# Patient Record
Sex: Female | Born: 1955 | Race: White | Hispanic: No | Marital: Married | Smoking: Former smoker
Health system: Southern US, Community
[De-identification: ages and names within clinical notes are randomized; demographics above are authoritative.]

## PROBLEM LIST (undated history)

## (undated) DIAGNOSIS — M1990 Unspecified osteoarthritis, unspecified site: Secondary | ICD-10-CM

## (undated) DIAGNOSIS — R12 Heartburn: Secondary | ICD-10-CM

## (undated) DIAGNOSIS — R319 Hematuria, unspecified: Secondary | ICD-10-CM

## (undated) DIAGNOSIS — C801 Malignant (primary) neoplasm, unspecified: Secondary | ICD-10-CM

## (undated) DIAGNOSIS — Z905 Acquired absence of kidney: Secondary | ICD-10-CM

## (undated) DIAGNOSIS — E785 Hyperlipidemia, unspecified: Secondary | ICD-10-CM

## (undated) DIAGNOSIS — J939 Pneumothorax, unspecified: Secondary | ICD-10-CM

## (undated) HISTORY — DX: Heartburn: R12

## (undated) HISTORY — PX: KIDNEY SURGERY: SHX687

## (undated) HISTORY — DX: Unspecified osteoarthritis, unspecified site: M19.90

## (undated) HISTORY — DX: Pneumothorax, unspecified: J93.9

## (undated) HISTORY — DX: Hematuria, unspecified: R31.9

## (undated) HISTORY — DX: Hyperlipidemia, unspecified: E78.5

---

## 2004-05-01 DIAGNOSIS — I2699 Other pulmonary embolism without acute cor pulmonale: Secondary | ICD-10-CM

## 2004-05-01 DIAGNOSIS — J939 Pneumothorax, unspecified: Secondary | ICD-10-CM

## 2004-05-01 HISTORY — DX: Other pulmonary embolism without acute cor pulmonale: I26.99

## 2004-05-01 HISTORY — DX: Pneumothorax, unspecified: J93.9

## 2005-01-09 ENCOUNTER — Ambulatory Visit: Payer: Self-pay

## 2005-10-22 ENCOUNTER — Inpatient Hospital Stay: Payer: Self-pay | Admitting: Internal Medicine

## 2005-10-22 ENCOUNTER — Other Ambulatory Visit: Payer: Self-pay

## 2006-04-09 ENCOUNTER — Ambulatory Visit: Payer: Self-pay | Admitting: Internal Medicine

## 2006-04-19 ENCOUNTER — Ambulatory Visit: Payer: Self-pay | Admitting: Internal Medicine

## 2006-05-03 ENCOUNTER — Ambulatory Visit: Payer: Self-pay | Admitting: Internal Medicine

## 2006-06-01 ENCOUNTER — Ambulatory Visit: Payer: Self-pay | Admitting: Internal Medicine

## 2006-06-01 ENCOUNTER — Ambulatory Visit: Payer: Self-pay | Admitting: Psychiatry

## 2006-07-05 ENCOUNTER — Ambulatory Visit: Payer: Self-pay | Admitting: Internal Medicine

## 2006-07-17 ENCOUNTER — Ambulatory Visit: Payer: Self-pay | Admitting: Psychiatry

## 2006-07-31 ENCOUNTER — Ambulatory Visit: Payer: Self-pay | Admitting: Internal Medicine

## 2006-09-06 ENCOUNTER — Other Ambulatory Visit: Payer: Self-pay

## 2006-09-06 ENCOUNTER — Emergency Department: Payer: Self-pay | Admitting: Unknown Physician Specialty

## 2006-09-30 ENCOUNTER — Ambulatory Visit: Payer: Self-pay | Admitting: Internal Medicine

## 2006-10-17 ENCOUNTER — Ambulatory Visit: Payer: Self-pay | Admitting: Internal Medicine

## 2006-10-30 ENCOUNTER — Ambulatory Visit: Payer: Self-pay | Admitting: Internal Medicine

## 2007-01-24 ENCOUNTER — Ambulatory Visit: Payer: Self-pay

## 2007-11-18 ENCOUNTER — Ambulatory Visit: Payer: Self-pay

## 2008-12-01 ENCOUNTER — Ambulatory Visit: Payer: Self-pay | Admitting: Obstetrics and Gynecology

## 2009-10-01 ENCOUNTER — Emergency Department: Payer: Self-pay | Admitting: Emergency Medicine

## 2009-12-14 ENCOUNTER — Ambulatory Visit: Payer: Self-pay

## 2010-02-11 ENCOUNTER — Ambulatory Visit: Payer: Self-pay | Admitting: Gastroenterology

## 2010-06-25 ENCOUNTER — Ambulatory Visit: Payer: Self-pay | Admitting: Internal Medicine

## 2010-12-01 ENCOUNTER — Ambulatory Visit: Payer: Self-pay | Admitting: Obstetrics and Gynecology

## 2011-03-23 ENCOUNTER — Emergency Department: Payer: Self-pay | Admitting: Emergency Medicine

## 2011-12-05 ENCOUNTER — Ambulatory Visit: Payer: Self-pay

## 2011-12-11 ENCOUNTER — Ambulatory Visit: Payer: Self-pay

## 2012-07-24 ENCOUNTER — Emergency Department: Payer: Self-pay

## 2012-07-24 LAB — BASIC METABOLIC PANEL
Anion Gap: 6 — ABNORMAL LOW (ref 7–16)
Co2: 27 mmol/L (ref 21–32)
EGFR (African American): >60
Potassium: 4 mmol/L (ref 3.5–5.1)

## 2012-07-24 LAB — CBC
HCT: 37.3 % (ref 35.0–47.0)
HGB: 12.4 g/dL (ref 12.0–16.0)
MCH: 29.2 pg (ref 26.0–34.0)
MCHC: 33.3 g/dL (ref 32.0–36.0)
Platelet: 360 10*3/uL (ref 150–440)
RBC: 4.25 10*6/uL (ref 3.80–5.20)
WBC: 5.7 10*3/uL (ref 3.6–11.0)

## 2012-07-24 LAB — TROPONIN I: Troponin-I: <0.02 ng/mL

## 2012-07-26 ENCOUNTER — Emergency Department: Payer: Self-pay | Admitting: Emergency Medicine

## 2012-12-11 ENCOUNTER — Ambulatory Visit: Payer: Self-pay

## 2013-05-30 ENCOUNTER — Ambulatory Visit: Payer: Self-pay | Admitting: Family Medicine

## 2013-12-19 ENCOUNTER — Ambulatory Visit: Payer: Self-pay

## 2013-12-19 ENCOUNTER — Emergency Department: Payer: Self-pay | Admitting: Emergency Medicine

## 2013-12-19 LAB — BASIC METABOLIC PANEL
Anion Gap: 7 (ref 7–16)
BUN: 21 mg/dL — AB (ref 7–18)
CHLORIDE: 106 mmol/L (ref 98–107)
CREATININE: 0.99 mg/dL (ref 0.60–1.30)
Calcium, Total: 8.6 mg/dL (ref 8.5–10.1)
Co2: 28 mmol/L (ref 21–32)
EGFR (African American): >60
EGFR (Non-African Amer.): >60
GLUCOSE: 91 mg/dL (ref 65–99)
Osmolality: 284 (ref 275–301)
Potassium: 3.9 mmol/L (ref 3.5–5.1)
Sodium: 141 mmol/L (ref 136–145)

## 2013-12-19 LAB — CBC
HCT: 37.4 % (ref 35.0–47.0)
HGB: 12.2 g/dL (ref 12.0–16.0)
MCH: 29.1 pg (ref 26.0–34.0)
MCHC: 32.6 g/dL (ref 32.0–36.0)
MCV: 89 fL (ref 80–100)
PLATELETS: 275 10*3/uL (ref 150–440)
RBC: 4.19 10*6/uL (ref 3.80–5.20)
RDW: 13.1 % (ref 11.5–14.5)
WBC: 5.2 10*3/uL (ref 3.6–11.0)

## 2013-12-19 LAB — TROPONIN I: Troponin-I: <0.02 ng/mL

## 2013-12-19 LAB — D-DIMER(ARMC): D-Dimer: 372 ng/ml

## 2013-12-23 ENCOUNTER — Ambulatory Visit: Payer: Self-pay

## 2014-04-25 ENCOUNTER — Emergency Department: Payer: Self-pay | Admitting: Emergency Medicine

## 2014-08-29 ENCOUNTER — Emergency Department
Admit: 2014-08-29 | Disposition: A | Discharge status: Home / Self Care | Payer: Self-pay | Admitting: Emergency Medicine

## 2014-09-09 ENCOUNTER — Other Ambulatory Visit: Payer: Self-pay | Admitting: Surgery

## 2014-09-09 DIAGNOSIS — M7581 Other shoulder lesions, right shoulder: Secondary | ICD-10-CM

## 2014-09-09 DIAGNOSIS — S46911A Strain of unspecified muscle, fascia and tendon at shoulder and upper arm level, right arm, initial encounter: Secondary | ICD-10-CM

## 2014-09-15 ENCOUNTER — Ambulatory Visit
Admission: RE | Admit: 2014-09-15 | Discharge: 2014-09-15 | Disposition: A | Discharge status: Home / Self Care | Payer: BLUE CROSS/BLUE SHIELD | Source: Ambulatory Visit | Attending: Surgery | Admitting: Surgery

## 2014-09-15 DIAGNOSIS — M7500 Adhesive capsulitis of unspecified shoulder: Secondary | ICD-10-CM | POA: Insufficient documentation

## 2014-09-15 DIAGNOSIS — X58XXXA Exposure to other specified factors, initial encounter: Secondary | ICD-10-CM | POA: Diagnosis not present

## 2014-09-15 DIAGNOSIS — S43431A Superior glenoid labrum lesion of right shoulder, initial encounter: Secondary | ICD-10-CM | POA: Insufficient documentation

## 2014-09-15 DIAGNOSIS — M7581 Other shoulder lesions, right shoulder: Secondary | ICD-10-CM | POA: Diagnosis present

## 2014-09-15 DIAGNOSIS — S46911A Strain of unspecified muscle, fascia and tendon at shoulder and upper arm level, right arm, initial encounter: Secondary | ICD-10-CM

## 2014-12-25 ENCOUNTER — Other Ambulatory Visit: Payer: Self-pay | Admitting: Obstetrics and Gynecology

## 2014-12-25 DIAGNOSIS — Z1231 Encounter for screening mammogram for malignant neoplasm of breast: Secondary | ICD-10-CM

## 2014-12-28 ENCOUNTER — Ambulatory Visit
Admission: RE | Admit: 2014-12-28 | Discharge: 2014-12-28 | Disposition: A | Discharge status: Home / Self Care | Payer: BLUE CROSS/BLUE SHIELD | Source: Ambulatory Visit | Attending: Obstetrics and Gynecology | Admitting: Obstetrics and Gynecology

## 2014-12-28 DIAGNOSIS — Z1231 Encounter for screening mammogram for malignant neoplasm of breast: Secondary | ICD-10-CM | POA: Insufficient documentation

## 2014-12-28 HISTORY — DX: Malignant (primary) neoplasm, unspecified: C80.1

## 2015-04-07 ENCOUNTER — Other Ambulatory Visit: Payer: Self-pay | Admitting: Obstetrics and Gynecology

## 2015-11-11 ENCOUNTER — Other Ambulatory Visit: Payer: Self-pay | Admitting: Obstetrics and Gynecology

## 2015-11-11 DIAGNOSIS — Z1231 Encounter for screening mammogram for malignant neoplasm of breast: Secondary | ICD-10-CM

## 2015-12-10 ENCOUNTER — Other Ambulatory Visit: Payer: Self-pay | Admitting: Obstetrics and Gynecology

## 2015-12-10 DIAGNOSIS — N63 Unspecified lump in unspecified breast: Secondary | ICD-10-CM

## 2015-12-20 DIAGNOSIS — IMO0002 Reserved for concepts with insufficient information to code with codable children: Secondary | ICD-10-CM | POA: Insufficient documentation

## 2015-12-21 ENCOUNTER — Encounter (INDEPENDENT_AMBULATORY_CARE_PROVIDER_SITE_OTHER): Payer: Self-pay

## 2015-12-21 ENCOUNTER — Encounter: Payer: Self-pay | Admitting: Oncology

## 2015-12-21 ENCOUNTER — Inpatient Hospital Stay: Payer: BLUE CROSS/BLUE SHIELD | Attending: Oncology | Admitting: Oncology

## 2015-12-21 ENCOUNTER — Inpatient Hospital Stay: Payer: BLUE CROSS/BLUE SHIELD

## 2015-12-21 DIAGNOSIS — IMO0002 Reserved for concepts with insufficient information to code with codable children: Secondary | ICD-10-CM

## 2015-12-21 DIAGNOSIS — Z315 Encounter for genetic counseling: Secondary | ICD-10-CM | POA: Insufficient documentation

## 2015-12-21 DIAGNOSIS — Z7982 Long term (current) use of aspirin: Secondary | ICD-10-CM

## 2015-12-21 DIAGNOSIS — Z803 Family history of malignant neoplasm of breast: Secondary | ICD-10-CM | POA: Insufficient documentation

## 2015-12-21 DIAGNOSIS — Z85828 Personal history of other malignant neoplasm of skin: Secondary | ICD-10-CM | POA: Insufficient documentation

## 2015-12-21 DIAGNOSIS — Z79899 Other long term (current) drug therapy: Secondary | ICD-10-CM | POA: Diagnosis not present

## 2015-12-21 DIAGNOSIS — F419 Anxiety disorder, unspecified: Secondary | ICD-10-CM | POA: Diagnosis not present

## 2015-12-23 NOTE — Progress Notes (Signed)
[facility]  [phone] [phone]  ID: Pam Drown OB: 25-Aug-1955  MR#: [id number]  [id number]  Patient Care Team: No Pcp Per Patient as PCP - General (General Practice)  CHIEF COMPLAINT: Genetic counseling and testing.  INTERVAL HISTORY: Patient is a 60 year old female with no personal history of malignancy, but extensive family history of breast cancer. She is referred for genetic counseling and consideration of testing. She is anxious, but otherwise feels well. She has no neurological point. She denies any recent fevers or illnesses. She denies any pain. She has no chest pain or shortness of breath. She denies any nausea, vomiting, constipation, or diarrhea. She has no urinary complaints. Patient otherwise feels well and offers no further specific complaints.  REVIEW OF SYSTEMS:   Review of Systems  Constitutional: Negative.  Negative for fever and malaise/fatigue.  Respiratory: Negative.  Negative for cough.   Cardiovascular: Negative.  Negative for chest pain.  Gastrointestinal: Negative.  Negative for abdominal pain.  Genitourinary: Negative.   Musculoskeletal: Negative.   Neurological: Negative.  Negative for weakness.  Psychiatric/Behavioral: The patient is nervous/anxious.     As per HPI. Otherwise, a complete review of systems is negatve.  PAST MEDICAL HISTORY: Past Medical History:  Diagnosis Date  . Cancer ([place])    skin     PAST SURGICAL HISTORY: No past surgical history on file.  FAMILY HISTORY: Family History  Problem Relation Age of Onset  . Breast cancer Mother 29  . Breast cancer Paternal Aunt     great aunt  . Breast cancer Cousin 48    maternal cousin       ADVANCED DIRECTIVES (Y/N):  N   HEALTH MAINTENANCE: Social History  Substance Use Topics  . Smoking status: Not on file  . Smokeless tobacco: Not on file  . Alcohol use Not on file     Colonoscopy:  PAP:  Bone density:  Lipid  panel:  Allergies  Allergen Reactions  . Etodolac Hives  . Lidocaine Other (See Comments)    No effect  . Sulfa Antibiotics Hives    Current Outpatient Prescriptions  Medication Sig Dispense Refill  . aspirin EC 81 MG tablet Take 81 mg by mouth daily.    . AZO-CRANBERRY PO Take by mouth.    . Biotin 5 MG CAPS Take by mouth.    . Calcium Carbonate-Vitamin D 600-400 MG-UNIT tablet Take 1 tablet by mouth daily.     . clobetasol cream (TEMOVATE) 0.05 % APPLY TO AFFECTED AREAS ON TOES TWICE DAILY AS NEEDED  1  . conjugated estrogens (PREMARIN) vaginal cream Place vaginally.    . Multiple Vitamin (MULTI-VITAMINS) TABS Take by mouth.    . naproxen sodium (ANAPROX) 220 MG tablet Take by mouth.    Marland Kitchen PREVIDENT 5000 SENSITIVE 1.1-5 % PSTE BRUSH TEETH 2 TO 3 TIMES A DAY AS DIRECTED  5  . Probiotic Product (PROBIOTIC DAILY PO) Take by mouth.     No current facility-administered medications for this visit.     OBJECTIVE: Vitals:   12/21/15 1530  BP: 101/65  Pulse: 65  Resp: 18  Temp: (!) 96.4 F (35.8 C)     There is no height or weight on file to calculate BMI.    ECOG FS:0 - Asymptomatic  General: Well-developed, well-nourished, no acute distress. Eyes: Pink conjunctiva, anicteric sclera. Musculoskeletal: No edema, cyanosis, or clubbing. Neuro: Alert, answering all questions appropriately. Cranial nerves grossly intact. Skin: No rashes or petechiae noted. Psych: Normal  affect.   LAB RESULTS:  Lab Results  Component Value Date   NA 141 12/19/2013   K 3.9 12/19/2013   CL 106 12/19/2013   CO2 28 12/19/2013   GLUCOSE 91 12/19/2013   BUN 21 (H) 12/19/2013   CREATININE 0.99 12/19/2013   CALCIUM 8.6 12/19/2013   GFRNONAA >60 12/19/2013   GFRAA >60 12/19/2013    Lab Results  Component Value Date   WBC 5.2 12/19/2013   HGB 12.2 12/19/2013   HCT 37.4 12/19/2013   MCV 89 12/19/2013   PLT 275 12/19/2013     STUDIES: No results found.  ASSESSMENT: Genetic counseling  and testing.  PLAN:    1.  Genetic counseling and testing: Although patient does not have a personal history of breast cancer, her mother had cancer to young age of 67 and she also had a maternal cousin that had breast cancer in her 55s. Will proceed with my risk genetic testing today. If results are negative, no further follow-up is necessary. Patient was educated on the importance of continuing her yearly screening mammograms given her family history. If patient is found to have a genetic mutation, she will return to clinic to discuss the results at which point we will have a further detailed discussion on prophylactic measures she can take. Also if positive, we will discuss testing her children.  Approximately 45 minutes was spent in discussion of which greater than 50% was consultation.  Patient expressed understanding and was in agreement with this plan. She also understands that She can call clinic at any time with any questions, concerns, or complaints.    Lloyd Huger, MD   12/23/2015 8:52 AM

## 2015-12-24 ENCOUNTER — Ambulatory Visit
Admission: RE | Admit: 2015-12-24 | Discharge: 2015-12-24 | Disposition: A | Discharge status: Home / Self Care | Payer: BLUE CROSS/BLUE SHIELD | Source: Ambulatory Visit | Attending: Obstetrics and Gynecology | Admitting: Obstetrics and Gynecology

## 2015-12-24 DIAGNOSIS — N63 Unspecified lump in unspecified breast: Secondary | ICD-10-CM

## 2015-12-29 ENCOUNTER — Ambulatory Visit: Payer: BLUE CROSS/BLUE SHIELD

## 2016-01-05 ENCOUNTER — Telehealth: Payer: Self-pay | Admitting: *Deleted

## 2016-01-05 NOTE — Telephone Encounter (Signed)
Patient notified of negative genetic testing results. Patient verbalized understanding of results. Copy of results will be sent to patient.

## 2016-01-17 ENCOUNTER — Encounter: Payer: Self-pay | Admitting: Urology

## 2016-01-17 ENCOUNTER — Ambulatory Visit (INDEPENDENT_AMBULATORY_CARE_PROVIDER_SITE_OTHER): Payer: BLUE CROSS/BLUE SHIELD | Admitting: Urology

## 2016-01-17 VITALS — BP 101/68 | HR 76 | Ht 66.0 in | Wt 129.2 lb

## 2016-01-17 DIAGNOSIS — IMO0002 Reserved for concepts with insufficient information to code with codable children: Secondary | ICD-10-CM

## 2016-01-17 DIAGNOSIS — R3129 Other microscopic hematuria: Secondary | ICD-10-CM

## 2016-01-17 DIAGNOSIS — Q600 Renal agenesis, unilateral: Secondary | ICD-10-CM

## 2016-01-17 LAB — URINALYSIS, COMPLETE
BILIRUBIN UA: NEGATIVE
Glucose, UA: NEGATIVE
Ketones, UA: NEGATIVE
NITRITE UA: NEGATIVE
PH UA: 5.5 (ref 5.0–7.5)
PROTEIN UA: NEGATIVE
Specific Gravity, UA: <1.005 — ABNORMAL LOW (ref 1.005–1.030)
UUROB: 0.2 mg/dL (ref 0.2–1.0)

## 2016-01-17 LAB — MICROSCOPIC EXAMINATION: Epithelial Cells (non renal): >10 /hpf — AB (ref 0–10)

## 2016-01-17 NOTE — Progress Notes (Signed)
01/17/2016 2:49 PM   Donna Cochran Mar 20, 1956 [id number]  Referring provider: Benjaman Kindler, MD [place], [place] [address]  Chief Complaint  Patient presents with  . Hematuria    new patient referred by Dr. Leafy Ro    HPI: Patient is a 60 -year-old Caucasian female who presents today as a referral from their PCP, Dr. Leafy Ro, for microscopic hematuria.  Patient was found to have microscopic hematuria on several occasions with up to 10RBC's/hpf.  Patient doesn't have a prior history of microscopic hematuria.    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    She does not have a prior history of recurrent urinary tract infections, nephrolithiasis or malignancies of the genitourinary tract.   She does not have a family medical history of nephrolithiasis, malignancies of the genitourinary tract or hematuria.   Today, she is having symptoms of urgency (in the am), nocturia x 1 and incontinence (mild SUI in the am).  She is not having dysuria,  hesitancy, intermittency, straining to urinate or a weak urinary stream.  Her UA today demonstrates 0-2 RBC's/hpf.    She is not experiencing any suprapubic pain, abdominal pain or flank pain.  She denies any recent fevers, chills, nausea or vomiting.   She has not had any recent imaging studies.   She is a former smoker, with a 10 cigs history.  Quit 44 years ago.  She was around secondhand smoke as a child.   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    PMH: Past Medical History:  Diagnosis Date  . Arthritis   . Cancer ([place])    skin   . Heartburn   . Hematuria   . HLD (hyperlipidemia)   . Pneumothorax 2006    Surgical History: Past Surgical History:  Procedure Laterality Date  . KIDNEY SURGERY Left    removal of kidney    Home Medications:    Medication List       Accurate as of 01/17/16   2:49 PM. Always use your most recent med list.          aspirin EC 81 MG tablet Take 81 mg by mouth daily.   AZO-CRANBERRY PO Take by mouth.   Biotin 5 MG Caps Take by mouth.   Calcium Carbonate-Vitamin D 600-400 MG-UNIT tablet Take 1 tablet by mouth daily.   clobetasol cream 0.05 % Commonly known as:  TEMOVATE APPLY TO AFFECTED AREAS ON TOES TWICE DAILY AS NEEDED   conjugated estrogens vaginal cream Commonly known as:  PREMARIN Place vaginally.   metroNIDAZOLE 500 MG tablet Commonly known as:  FLAGYL Take by mouth.   MULTI-VITAMINS Tabs Take by mouth.   naproxen sodium 220 MG tablet Commonly known as:  ANAPROX Take by mouth.   PREVIDENT 5000 SENSITIVE 1.1-5 % Pste Generic drug:  Sod Fluoride-Potassium Nitrate BRUSH TEETH 2 TO 3 TIMES A DAY AS DIRECTED   PROBIOTIC DAILY PO Take by mouth.       Allergies:  Allergies  Allergen Reactions  . Etodolac Hives  . Lidocaine Other (See Comments)    No effect  . Sulfa Antibiotics Hives    Family History: Family History  Problem Relation Age of Onset  . Breast cancer Mother 76  . Breast cancer Paternal Aunt     great aunt  . Breast cancer Cousin 59    maternal cousin  .  Kidney disease Neg Hx   . Bladder Cancer Neg Hx     Social History:  reports that she has quit smoking. She has never used smokeless tobacco. She reports that she does not drink alcohol or use drugs.  ROS: UROLOGY Frequent Urination?: No Hard to postpone urination?: Yes Burning/pain with urination?: No Get up at night to urinate?: Yes Leakage of urine?: Yes Urine stream starts and stops?: No Trouble starting stream?: No Do you have to strain to urinate?: No Blood in urine?: Yes Urinary tract infection?: No Sexually transmitted disease?: No Injury to kidneys or bladder?: No Painful intercourse?: No Weak stream?: No Currently pregnant?: No Vaginal bleeding?: No Last menstrual period?: n  Gastrointestinal Nausea?:  No Vomiting?: No Indigestion/heartburn?: Yes Diarrhea?: No Constipation?: Yes  Constitutional Fever: No Night sweats?: No Weight loss?: No Fatigue?: Yes  Skin Skin rash/lesions?: No Itching?: No  Eyes Blurred vision?: Yes Double vision?: Yes  Ears/Nose/Throat Sore throat?: No Sinus problems?: No  Hematologic/Lymphatic Swollen glands?: Yes Easy bruising?: Yes  Cardiovascular Leg swelling?: No Chest pain?: No  Respiratory Cough?: No Shortness of breath?: No  Endocrine Excessive thirst?: Yes  Musculoskeletal Back pain?: Yes Joint pain?: Yes  Neurological Headaches?: No Dizziness?: No  Psychologic Depression?: No Anxiety?: No  Physical Exam: BP 101/68   Pulse 76   Ht 5\' 6"  (1.676 m)   Wt 129 lb 3.2 oz (58.6 kg)   BMI 20.85 kg/m   Constitutional: Well nourished. Alert and oriented, No acute distress. HEENT: [place] AT, moist mucus membranes. Trachea midline, no masses. Cardiovascular: No clubbing, cyanosis, or edema. Respiratory: Normal respiratory effort, no increased work of breathing. GI: Abdomen is soft, non tender, non distended, no abdominal masses. Liver and spleen not palpable.  No hernias appreciated.  Stool sample for occult testing is not indicated.   GU: No CVA tenderness.  No bladder fullness or masses.  Atrophic external genitalia, normal pubic hair distribution, no lesions.  Normal urethral meatus, no lesions, no prolapse, no discharge.   No urethral masses, tenderness and/or tenderness. No bladder fullness, tenderness or masses. Normal vagina mucosa, poor estrogen effect, no discharge, no lesions, good pelvic support, no cystocele or rectocele noted.  No cervical motion tenderness.  Uterus is freely mobile and non-fixed.  No adnexal/parametria masses or tenderness noted.  Anus and perineum are without rashes or lesions.    Skin: No rashes, bruises or suspicious lesions. Lymph: No cervical or inguinal adenopathy. Neurologic: Grossly intact, no  focal deficits, moving all 4 extremities. Psychiatric: Normal mood and affect.  Laboratory Data: Lab Results  Component Value Date   WBC 5.2 12/19/2013   HGB 12.2 12/19/2013   HCT 37.4 12/19/2013   MCV 89 12/19/2013   PLT 275 12/19/2013    Lab Results  Component Value Date   CREATININE 0.99 12/19/2013    Urinalysis Unremarkable.  See EPIC.    Assessment & Plan:    1. Microscopic hematuria  -  I explained to the patient that there are a number of causes that can be associated with blood in the urine, such as stones, UTI's, damage to the urinary tract and/or cancer.  -  At this time, I felt that the patient warranted further urologic evaluation.   The AUA guidelines state that a CT urogram is the preferred imaging study to evaluate hematuria.  - I explained to the patient that a contrast material will be injected into a vein and that in rare instances, an allergic reaction can result and may  even life threatening   The patient denies any allergies to contrast, iodine and/or seafood and is not taking metformin.  - Her reproductive status is post menopausal  - Following the imaging study,  I've recommended a cystoscopy. I described how this is performed, typically in an office setting with a flexible cystoscope.  She states that lidocaine has no effect on her.  It doesn't numb her.  We described the risks, benefits, and possible side effects, the most common of which is a minor amount of blood in the urine and/or burning which usually resolves in 24 to 48 hours.    - The patient had the opportunity to ask questions which were answered. Based upon this discussion, the patient is willing to proceed. Therefore, I've ordered: a CT Urogram and cystoscopy.  - She will return following all of the above for discussion of the results.     - Urinalysis, Complete  - CULTURE, URINE COMPREHENSIVE  - BUN+Creat  2. Solitary kidney  - left nephrectomy for chronic UPJ obstruction  - see  above   Return for CT scan report and cystoscopy.  These notes generated with voice recognition software. I apologize for typographical errors.  Zara Council, [facility] [address], [place], [place] [address] [phone]

## 2016-01-17 NOTE — Patient Instructions (Addendum)
Hematuria, Adult Hematuria is blood in your urine. It can be caused by a bladder infection, kidney infection, prostate infection, kidney stone, or cancer of your urinary tract. Infections can usually be treated with medicine, and a kidney stone usually will pass through your urine. If neither of these is the cause of your hematuria, further workup to find out the reason may be needed. It is very important that you tell your health care provider about any blood you see in your urine, even if the blood stops without treatment or happens without causing pain. Blood in your urine that happens and then stops and then happens again can be a symptom of a very serious condition. Also, pain is not a symptom in the initial stages of many urinary cancers. HOME CARE INSTRUCTIONS   Drink lots of fluid, 3-4 quarts a day. If you have been diagnosed with an infection, cranberry juice is especially recommended, in addition to large amounts of water.  Avoid caffeine, tea, and carbonated beverages because they tend to irritate the bladder.  Avoid alcohol because it may irritate the prostate.  Take all medicines as directed by your health care provider.  If you were prescribed an antibiotic medicine, finish it all even if you start to feel better.  If you have been diagnosed with a kidney stone, follow your health care provider's instructions regarding straining your urine to catch the stone.  Empty your bladder often. Avoid holding urine for long periods of time.  After a bowel movement, women should cleanse front to back. Use each tissue only once.  Empty your bladder before and after sexual intercourse if you are a female. SEEK MEDICAL CARE IF:  You develop back pain.  You have a fever.  You have a feeling of sickness in your stomach (nausea) or vomiting.  Your symptoms are not better in 3 days. Return sooner if you are getting worse. SEEK IMMEDIATE MEDICAL CARE IF:   You develop severe vomiting and  are unable to keep the medicine down.  You develop severe back or abdominal pain despite taking your medicines.  You begin passing a large amount of blood or clots in your urine.  You feel extremely weak or faint, or you pass out. MAKE SURE YOU:   Understand these instructions.  Will watch your condition.  Will get help right away if you are not doing well or get worse.   This information is not intended to replace advice given to you by your health care provider. Make sure you discuss any questions you have with your health care provider.   Document Released: 04/17/2005 Document Revised: 05/08/2014 Document Reviewed: 12/16/2012 Elsevier Interactive Patient Education 2016 [place] Scan A computed tomography (CT) scan is a specialized X-ray scan. It uses X-rays and a computer to make pictures of different areas of your body. A CT scan can offer more detailed information than a regular X-ray exam. The CT scan provides data about internal organs, soft tissue structures, blood vessels, and bones.  The CT scanner is a large machine that takes pictures of your body as you move through the opening.  LET [facility] CARE PROVIDER KNOW ABOUT:  Any allergies you have.   All medicines you are taking, including vitamins, herbs, eye drops, creams, and over-the-counter medicines.   Previous problems you or members of your family have had with the use of anesthetics.   Any blood disorders you have.   Previous surgeries you have had.   Medical  conditions you have. RISKS AND COMPLICATIONS  Generally, this is a safe procedure. However, as with any procedure, problems can occur. Possible problems include:   An allergic reaction to the contrast material.   Development of cancer from excessive exposure to radiation. The risk of this is small.  BEFORE THE PROCEDURE   The day before the test, stop drinking caffeinated beverages. These include energy drinks, tea, soda, coffee,  and hot chocolate.   On the day of the test:  About 4 hours before the test, stop eating and drinking anything but water as advised by your health care provider.   Avoid wearing jewelry. You will have to partly or fully undress and wear a hospital gown. PROCEDURE   You will be asked to lie on a table with your arms above your head.   If contrast dye is to be used for the test, an IV tube will be inserted in your arm. The contrast dye will be injected into the IV tube. You might feel warm, or you may get a metallic taste in your mouth.   The table you will be lying on will move into a large machine that will do the scanning.   You will be able to see, hear, and talk to the person running the machine while you are in it. Follow that person's directions.   The CT machine will move around you to take pictures. Do not move while it is scanning. This helps to get a good image.   When the best possible pictures have been taken, the machine will be turned off. The table will be moved out of the machine. The IV tube will then be removed. AFTER THE PROCEDURE  Ask your health care provider when to follow up for your test results.   This information is not intended to replace advice given to you by your health care provider. Make sure you discuss any questions you have with your health care provider.   Document Released: 05/25/2004 Document Revised: 04/22/2013 Document Reviewed: 12/23/2012 Elsevier Interactive Patient Education [employer].  Cystoscopy Cystoscopy is a procedure that is used to help your caregiver diagnose and sometimes treat conditions that affect your lower urinary tract. Your lower urinary tract includes your bladder and the tube through which urine passes from your bladder out of your body (urethra). Cystoscopy is performed with a thin, tube-shaped instrument (cystoscope). The cystoscope has lenses and a light at the end so that your caregiver can see inside your  bladder. The cystoscope is inserted at the entrance of your urethra. Your caregiver guides it through your urethra and into your bladder. There are two main types of cystoscopy:  Flexible cystoscopy (with a flexible cystoscope).  Rigid cystoscopy (with a rigid cystoscope). Cystoscopy may be recommended for many conditions, including:  Urinary tract infections.  Blood in your urine (hematuria).  Loss of bladder control (urinary incontinence) or overactive bladder.  Unusual cells found in a urine sample.  Urinary blockage.  Painful urination. Cystoscopy may also be done to remove a sample of your tissue to be checked under a microscope (biopsy). It may also be done to remove or destroy bladder stones. LET YOUR CAREGIVER KNOW ABOUT:  Allergies to food or medicine.  Medicines taken, including vitamins, herbs, eyedrops, over-the-counter medicines, and creams.  Use of steroids (by mouth or creams).  Previous problems with anesthetics or numbing medicines.  History of bleeding problems or blood clots.  Previous surgery.  Other health problems, including diabetes and  kidney problems.  Possibility of pregnancy, if this applies. PROCEDURE The area around the opening to your urethra will be cleaned. A medicine to numb your urethra (local anesthetic) is used. If a tissue sample or stone is removed during the procedure, you may be given a medicine to make you sleep (general anesthetic). Your caregiver will gently insert the tip of the cystoscope into your urethra. The cystoscope will be slowly glided through your urethra and into your bladder. Sterile fluid will flow through the cystoscope and into your bladder. The fluid will expand and stretch your bladder. This gives your caregiver a better view of your bladder walls. The procedure lasts about 15-20 minutes. AFTER THE PROCEDURE If a local anesthetic is used, you will be allowed to go home as soon as you are ready. If a general  anesthetic is used, you will be taken to a recovery area until you are stable. You may have temporary bleeding and burning on urination.   This information is not intended to replace advice given to you by your health care provider. Make sure you discuss any questions you have with your health care provider.   Document Released: 04/14/2000 Document Revised: 05/08/2014 Document Reviewed: 10/09/2011 Elsevier Interactive Patient Education [employer].

## 2016-01-18 LAB — BUN+CREAT
BUN / CREAT RATIO: 17 (ref 12–28)
BUN: 17 mg/dL (ref 8–27)
CREATININE: 0.99 mg/dL (ref 0.57–1.00)
GFR calc non Af Amer: 61 mL/min/{1.73_m2} (ref >59)
GFR, EST AFRICAN AMERICAN: 71 mL/min/{1.73_m2} (ref >59)

## 2016-01-19 LAB — CULTURE, URINE COMPREHENSIVE

## 2016-01-28 ENCOUNTER — Ambulatory Visit
Admission: RE | Admit: 2016-01-28 | Discharge: 2016-01-28 | Disposition: A | Discharge status: Home / Self Care | Payer: BLUE CROSS/BLUE SHIELD | Source: Ambulatory Visit | Attending: Urology | Admitting: Urology

## 2016-01-28 DIAGNOSIS — R3129 Other microscopic hematuria: Secondary | ICD-10-CM | POA: Diagnosis present

## 2016-01-28 DIAGNOSIS — K7689 Other specified diseases of liver: Secondary | ICD-10-CM | POA: Insufficient documentation

## 2016-01-28 DIAGNOSIS — Z905 Acquired absence of kidney: Secondary | ICD-10-CM | POA: Insufficient documentation

## 2016-01-28 MED ORDER — IOPAMIDOL (ISOVUE-300) INJECTION 61%
125.0000 mL | Freq: Once | INTRAVENOUS | Status: AC | PRN
  Administered 2016-01-28: 125 mL via INTRAVENOUS

## 2016-02-07 ENCOUNTER — Ambulatory Visit: Payer: BLUE CROSS/BLUE SHIELD | Admitting: Urology

## 2016-02-07 VITALS — BP 94/64 | Ht 66.0 in | Wt 126.6 lb

## 2016-02-07 DIAGNOSIS — R3129 Other microscopic hematuria: Secondary | ICD-10-CM | POA: Insufficient documentation

## 2016-02-07 DIAGNOSIS — N135 Crossing vessel and stricture of ureter without hydronephrosis: Secondary | ICD-10-CM | POA: Insufficient documentation

## 2016-02-07 LAB — URINALYSIS, COMPLETE
BILIRUBIN UA: NEGATIVE
GLUCOSE, UA: NEGATIVE
KETONES UA: NEGATIVE
Leukocytes, UA: NEGATIVE
NITRITE UA: NEGATIVE
PROTEIN UA: NEGATIVE
SPEC GRAV UA: 1.025 (ref 1.005–1.030)
UUROB: 0.2 mg/dL (ref 0.2–1.0)
pH, UA: 5.5 (ref 5.0–7.5)

## 2016-02-07 LAB — MICROSCOPIC EXAMINATION: Bacteria, UA: NONE SEEN

## 2016-02-07 MED ORDER — CIPROFLOXACIN HCL 500 MG PO TABS
500.0000 mg | ORAL_TABLET | Freq: Once | ORAL | Status: AC
  Administered 2016-02-07: 500 mg via ORAL

## 2016-02-07 NOTE — Progress Notes (Signed)
   Pt returns for cystoscopy and to review CT. She presented 01/17/2016 with h/o MH. Her bun was 17, cr 0.99. Urine cx mixed.   She has a h/o left Nx for UPJ obs. Her 01/28/2016 CT revealed a right UPJ with mild to moderate dilation of the calyces and renal pelvis, but a normal ureter. Also, there was normal uptake and excretion. The left kidney is absent. I reviewed all the images.   She's been told the right kidney was "shifted". She's had no gross hematuria or dysuria.   Cystoscopy Procedure Note  Patient identification was confirmed, informed consent was obtained, and patient was prepped using Betadine solution.  Chaperone - Kalita  PE: Abd soft, NT, no mass GU: urethra and bladder palpably normal on vaginal exam. Meatus appears normal. Mild atrophy.   Preoperative abx where received prior to procedure.    Procedure: - Flexible cystoscope introduced, without any difficulty.   - Thorough search of the bladder revealed:    normal urethral meatus    normal urothelium    no stones    no ulcers     no tumors    no urethral polyps    no trabeculation    no foreign bodies   - ureteral orifices in normal position and appearance. Clear right efflux noted. No left efflux noted (prior left nephrectomy).   Post-Procedure: - Patient tolerated the procedure well  A/P:  1) MH - benign evaluation   2) Right UPJ - normal uptake and excretion on CT and normal kidney function, but given that she has a solitary kidney, we would want to make sure it is not developing a more severe UPJ. Therefore, will get a renal u/s in 6 mo and we can send bun Cr if it hasn't been done recently. I don't think she needs a mag-3 or RG at this point. She has no flank pain. If stable,in 6 mo, we could probably go to yearly f/u.

## 2016-08-01 ENCOUNTER — Ambulatory Visit
Admission: RE | Admit: 2016-08-01 | Discharge: 2016-08-01 | Disposition: A | Discharge status: Home / Self Care | Payer: BLUE CROSS/BLUE SHIELD | Source: Ambulatory Visit | Attending: Urology | Admitting: Urology

## 2016-08-01 DIAGNOSIS — N135 Crossing vessel and stricture of ureter without hydronephrosis: Secondary | ICD-10-CM

## 2016-08-01 DIAGNOSIS — Z905 Acquired absence of kidney: Secondary | ICD-10-CM | POA: Insufficient documentation

## 2016-08-01 DIAGNOSIS — R3129 Other microscopic hematuria: Secondary | ICD-10-CM | POA: Diagnosis not present

## 2016-08-04 ENCOUNTER — Ambulatory Visit: Payer: BLUE CROSS/BLUE SHIELD

## 2016-08-07 ENCOUNTER — Ambulatory Visit: Payer: BLUE CROSS/BLUE SHIELD | Admitting: Urology

## 2016-08-07 ENCOUNTER — Encounter: Payer: Self-pay | Admitting: Urology

## 2016-08-07 VITALS — BP 107/70 | HR 92 | Ht 66.0 in | Wt 130.4 lb

## 2016-08-07 DIAGNOSIS — R3129 Other microscopic hematuria: Secondary | ICD-10-CM | POA: Diagnosis not present

## 2016-08-07 NOTE — Progress Notes (Signed)
08/07/2016 2:37 PM   Donna Cochran 10-05-1955 [id number]  Referring provider: Glendon Axe, MD [facility] [place], [place] [address]  Chief Complaint  Patient presents with  . Follow-up    RUS    HPI: The patient was assessed for a single right kidney and ureteropelvic junction obstruction. Dr. Junious Silk wanted her followed with a renal ultrasound in 6 months and did not recommend a nuclear medicine study. 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 The right kidney was normal with no hydronephrosis or masses.  Voiding well and clinically noninfected   PMH: Past Medical History:  Diagnosis Date  . Arthritis   . Cancer ([place])    skin   . Heartburn   . Hematuria   . HLD (hyperlipidemia)   . Pneumothorax 2006    Surgical History: Past Surgical History:  Procedure Laterality Date  . KIDNEY SURGERY Left    removal of kidney    Home Medications:  Allergies as of 08/07/2016      Reactions   Etodolac Hives   Lidocaine Other (See Comments)   No effect   Sulfa Antibiotics Hives      Medication List       Accurate as of 08/07/16  2:37 PM. Always use your most recent med list.          aspirin EC 81 MG tablet Take 81 mg by mouth daily.   AZO-CRANBERRY PO Take by mouth.   Biotin 5 MG Caps Take by mouth.   Calcium Carbonate-Vitamin D 600-400 MG-UNIT tablet Take 1 tablet by mouth daily.   clobetasol cream 0.05 % Commonly known as:  TEMOVATE APPLY TO AFFECTED AREAS ON TOES TWICE DAILY AS NEEDED   conjugated estrogens vaginal cream Commonly known as:  PREMARIN Place vaginally.   hydrochlorothiazide 12.5 MG capsule Commonly known as:  MICROZIDE TAKE 1 CAPSULE (12.5 MG TOTAL) BY MOUTH ONCE DAILY.   metroNIDAZOLE 500 MG tablet Commonly known as:  FLAGYL Take by mouth.   MULTI-VITAMINS Tabs Take by mouth.   PREVIDENT 5000 SENSITIVE 1.1-5 % Pste Generic drug:  Sod Fluoride-Potassium Nitrate BRUSH  TEETH 2 TO 3 TIMES A DAY AS DIRECTED   PROBIOTIC DAILY PO Take by mouth.       Allergies:  Allergies  Allergen Reactions  . Etodolac Hives  . Lidocaine Other (See Comments)    No effect  . Sulfa Antibiotics Hives    Family History: Family History  Problem Relation Age of Onset  . Breast cancer Mother 34  . Breast cancer Paternal Aunt     great aunt  . Breast cancer Cousin 29    maternal cousin  . Kidney disease Neg Hx   . Bladder Cancer Neg Hx     Social History:  reports that she has quit smoking. She has never used smokeless tobacco. She reports that she does not drink alcohol or use drugs.  ROS: UROLOGY Frequent Urination?: No Hard to postpone urination?: No Burning/pain with urination?: No Get up at night to urinate?: No Leakage of urine?: No Urine stream starts and stops?: No Trouble starting stream?: No Do you have to strain to urinate?: No Blood in urine?: No Urinary tract infection?: No Sexually transmitted disease?: No Injury to kidneys or bladder?: No Painful intercourse?: No Weak stream?: No Currently pregnant?: No Vaginal bleeding?: No Last menstrual period?: n  Gastrointestinal Nausea?: No Vomiting?: No Indigestion/heartburn?: No Diarrhea?: No Constipation?: No  Constitutional Fever: No Night sweats?: No  Weight loss?: No Fatigue?: No  Skin Skin rash/lesions?: No Itching?: No  Eyes Blurred vision?: No Double vision?: No  Ears/Nose/Throat Sore throat?: No Sinus problems?: No  Hematologic/Lymphatic Swollen glands?: No Easy bruising?: Yes  Cardiovascular Leg swelling?: No Chest pain?: No  Respiratory Cough?: No Shortness of breath?: No  Endocrine Excessive thirst?: No  Musculoskeletal Back pain?: No Joint pain?: No  Neurological Headaches?: No Dizziness?: No  Psychologic Depression?: No Anxiety?: No  Physical Exam: BP 107/70 (BP Location: Left Arm, Patient Position: Sitting, Cuff Size: Normal)   Pulse 92    Ht 5\' 6"  (1.676 m)   Wt 130 lb 6.4 oz (59.1 kg)   BMI 21.05 kg/m   Constitutional:  Alert and oriented, No acute distress.   Laboratory Data: Lab Results  Component Value Date   WBC 5.2 12/19/2013   HGB 12.2 12/19/2013   HCT 37.4 12/19/2013   MCV 89 12/19/2013   PLT 275 12/19/2013    Lab Results  Component Value Date   CREATININE 0.99 01/17/2016    No results found for: PSA  No results found for: TESTOSTERONE  No results found for: HGBA1C  Urinalysis    Component Value Date/Time   APPEARANCEUR Clear 02/07/2016 1038   GLUCOSEU Negative 02/07/2016 1038   BILIRUBINUR Negative 02/07/2016 1038   PROTEINUR Negative 02/07/2016 1038   NITRITE Negative 02/07/2016 1038   LEUKOCYTESUR Negative 02/07/2016 1038    Pertinent Imaging: See above  Assessment & Plan:  The patient has a normal right kidney. She has been clear for microscopic hematuria. I will see her when necessary  There are no diagnoses linked to this encounter.  No Follow-up on file.  Reece Packer, MD  [facility] [address], [place], [place] [address] [phone]

## 2016-11-21 ENCOUNTER — Other Ambulatory Visit: Payer: Self-pay | Admitting: Obstetrics and Gynecology

## 2016-11-21 DIAGNOSIS — Z1231 Encounter for screening mammogram for malignant neoplasm of breast: Secondary | ICD-10-CM

## 2016-12-25 ENCOUNTER — Ambulatory Visit
Admission: RE | Admit: 2016-12-25 | Discharge: 2016-12-25 | Disposition: A | Discharge status: Home / Self Care | Payer: BLUE CROSS/BLUE SHIELD | Source: Ambulatory Visit | Attending: Obstetrics and Gynecology | Admitting: Obstetrics and Gynecology

## 2016-12-25 DIAGNOSIS — Z1231 Encounter for screening mammogram for malignant neoplasm of breast: Secondary | ICD-10-CM | POA: Insufficient documentation

## 2017-11-29 ENCOUNTER — Other Ambulatory Visit: Payer: Self-pay | Admitting: Obstetrics and Gynecology

## 2017-11-29 DIAGNOSIS — Z1231 Encounter for screening mammogram for malignant neoplasm of breast: Secondary | ICD-10-CM

## 2017-12-03 ENCOUNTER — Ambulatory Visit
Admission: RE | Admit: 2017-12-03 | Discharge: 2017-12-03 | Disposition: A | Discharge status: Home / Self Care | Payer: BLUE CROSS/BLUE SHIELD | Source: Ambulatory Visit | Attending: Obstetrics and Gynecology | Admitting: Obstetrics and Gynecology

## 2017-12-03 ENCOUNTER — Encounter (INDEPENDENT_AMBULATORY_CARE_PROVIDER_SITE_OTHER): Payer: Self-pay

## 2017-12-03 DIAGNOSIS — Z1231 Encounter for screening mammogram for malignant neoplasm of breast: Secondary | ICD-10-CM | POA: Diagnosis present

## 2018-04-10 ENCOUNTER — Other Ambulatory Visit: Payer: Self-pay

## 2018-04-11 ENCOUNTER — Other Ambulatory Visit: Payer: Self-pay

## 2018-04-11 DIAGNOSIS — Z7184 Encounter for health counseling related to travel: Secondary | ICD-10-CM

## 2018-04-11 NOTE — Progress Notes (Signed)
Lab visit only. 

## 2018-04-12 LAB — MEASLES/MUMPS/RUBELLA IMMUNITY
MUMPS ABS, IGG: 188 [AU]/ml (ref >10.9)
RUBELLA: 4.94 {index} (ref >0.99)

## 2018-04-14 IMAGING — MG MM DIGITAL DIAGNOSTIC BILAT W/ TOMO W/ CAD
8 of 19 series · 8 of 40 positions shown · non-contrast
Comparison: Mammography 12/28/2014 (bilateral), 12/23/2013 (left),
12/19/2013 (bilateral) and earlier.

CLINICAL DATA: Possible palpable lumps in the lower inner portions
of both breasts on recent clinical breast examination.

EXAM:
2D DIGITAL DIAGNOSTIC BILATERAL MAMMOGRAM WITH CAD AND ADJUNCT TOMO
LIMITED ULTRASOUND BILATERAL BREASTS

[L ML]
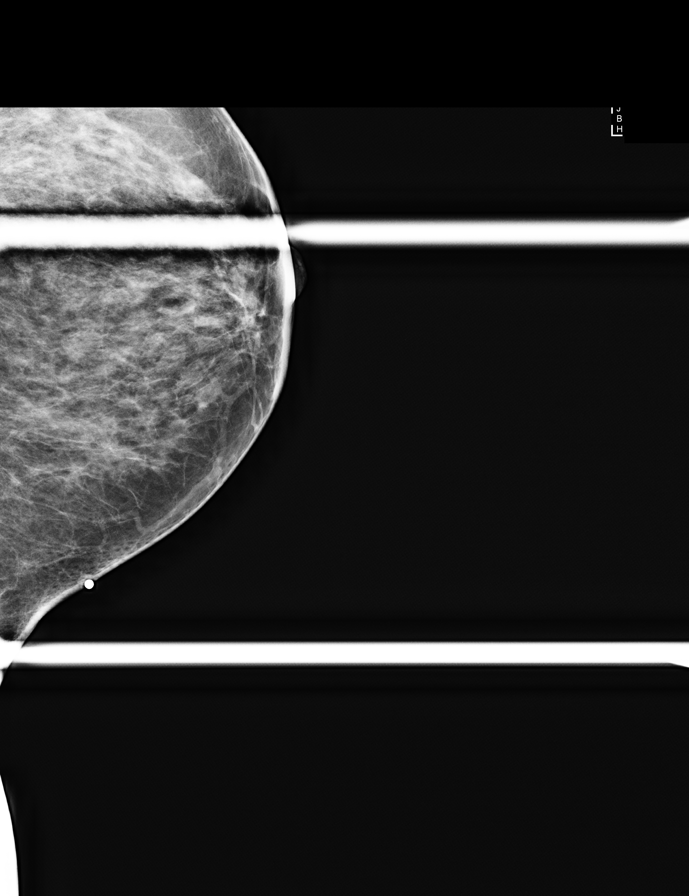

[L CC synth-2D]
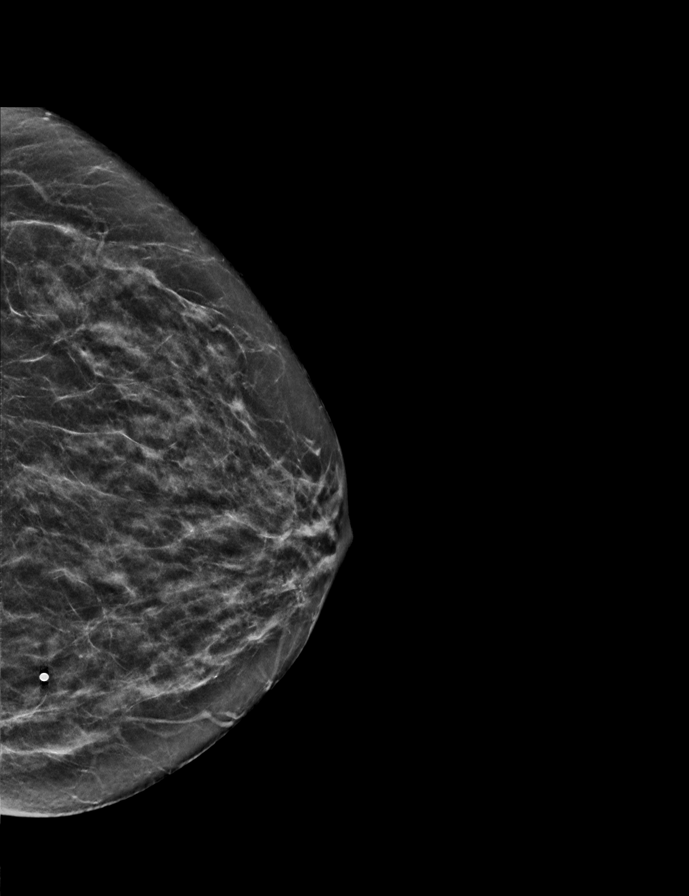

[R MLO synth-2D]
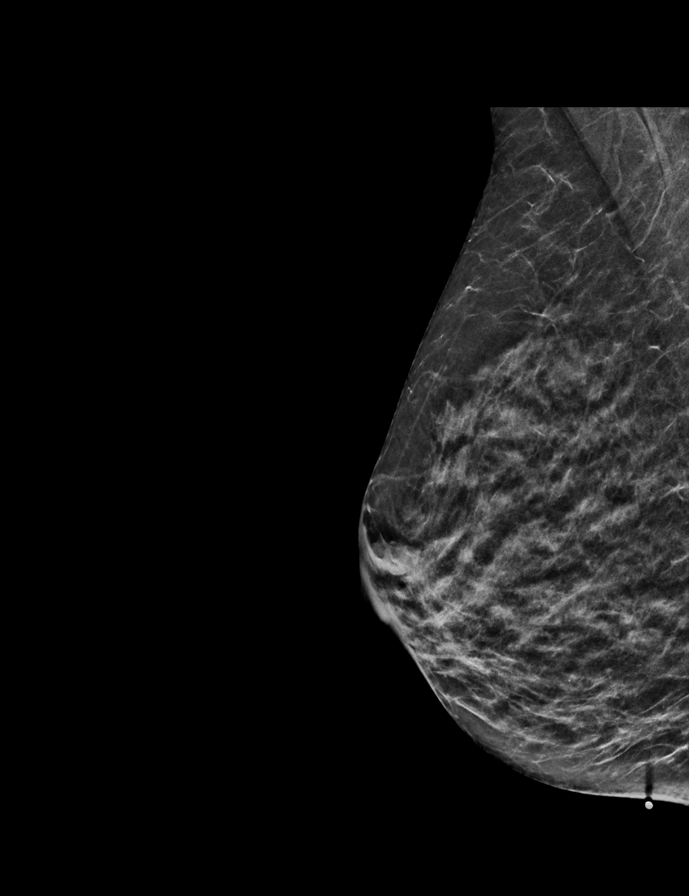

[L ML synth-2D (1 of 2)]
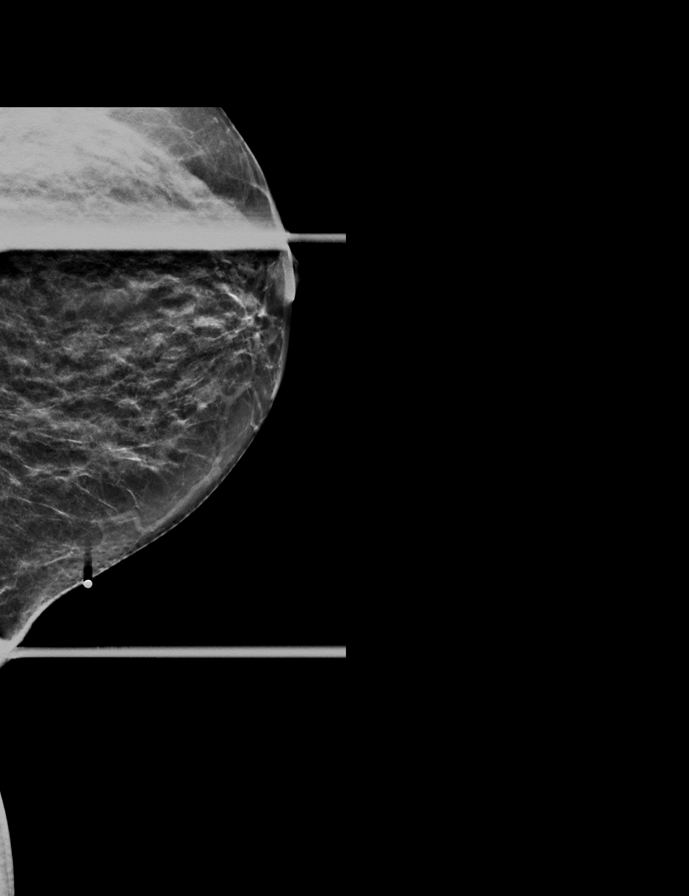

[L MLO]
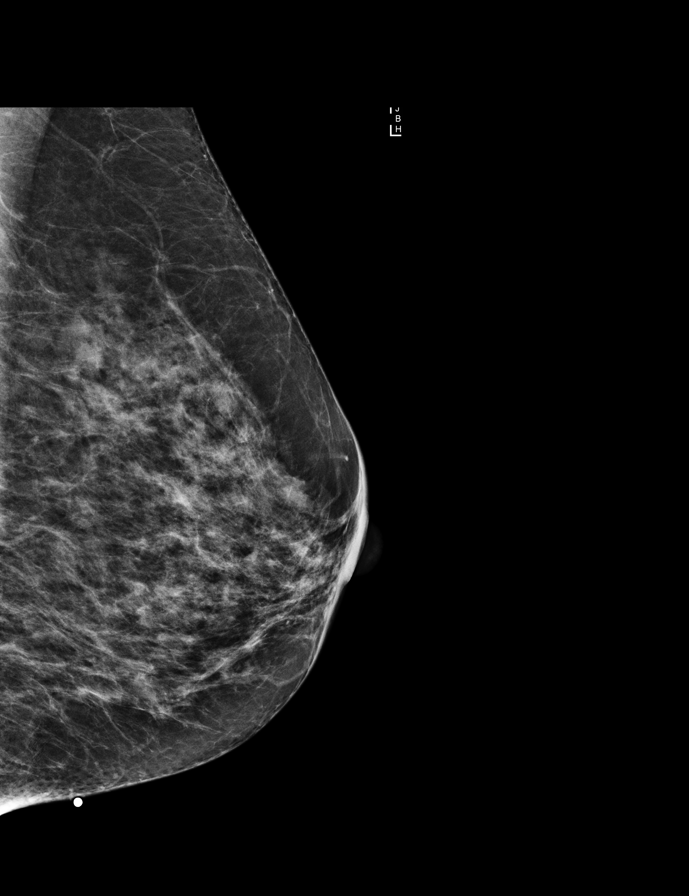

[L ML synth-2D (2 of 2)]
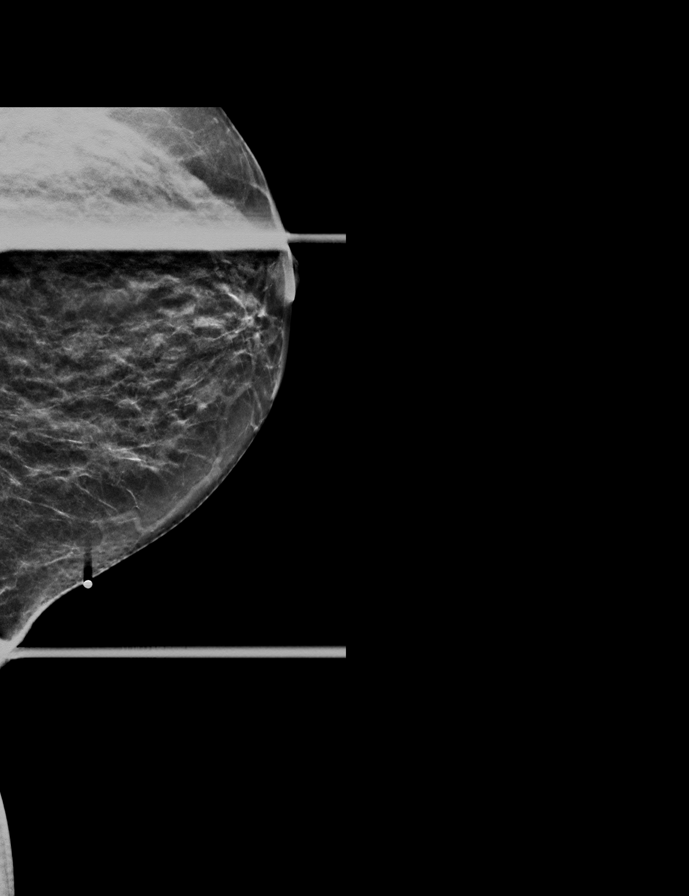

[R CC]
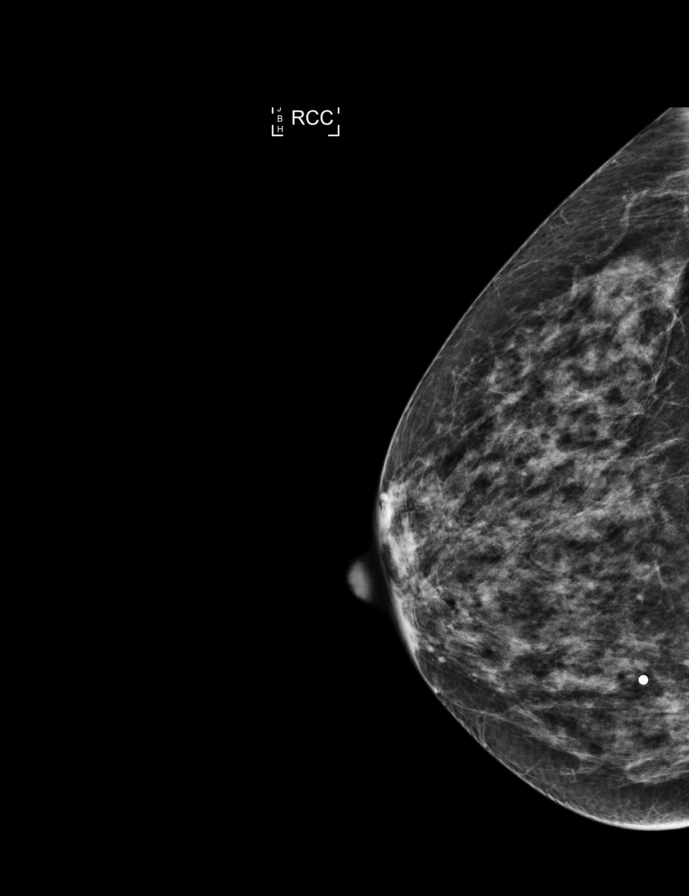

[L CC]
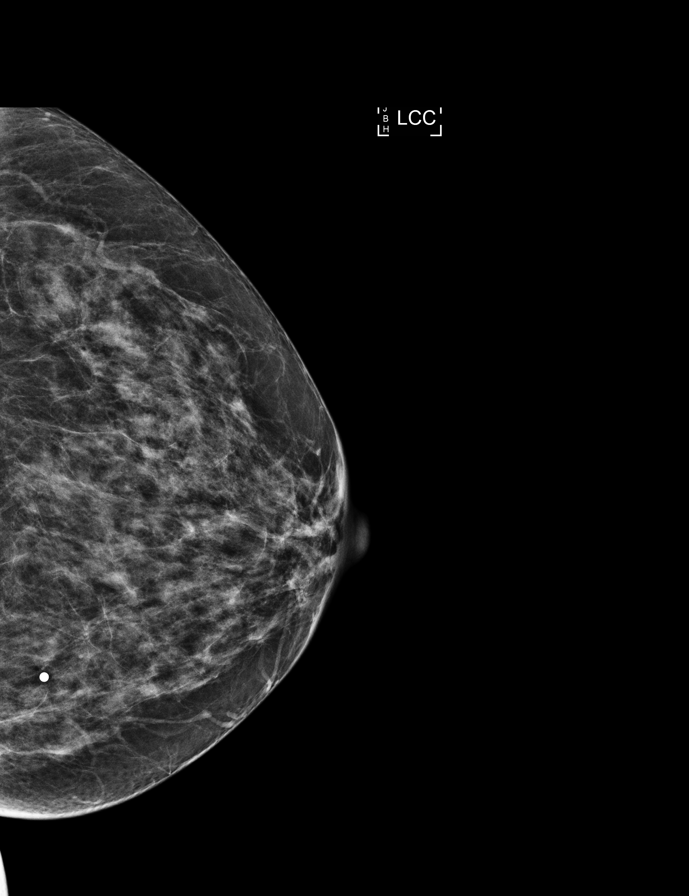

[8 of 40 positions shown; findings below may reference images not displayed]

No prior ultrasound.

ACR Breast Density Category c: The breast tissue is heterogeneously
dense, which may obscure small masses.
FINDINGS: Standard 2D and tomosynthesis full field CC and MLO views of both
breasts were obtained. Standard and tomosynthesis spot tangential
views of the areas of palpable concern in both breasts were also
obtained.

No mammographic abnormality in the area of palpable concern in the
lower inner right breast. No findings suspicious for malignancy in
the right breast.

No mammographic abnormality in the area palpable concern in the
lower inner left breast. No findings suspicious malignancy in the
left breast.

Mammographic images were processed with CAD.

On physical exam, the patient has prominent costal cartilage
bilaterally in the areas of palpable concern in the lower inner
portions of both breasts.

Targeted right breast ultrasound is performed, showing normal
fibrofatty and scattered fibroglandular tissue in the lower inner
quadrant in the area of palpable concern. Costal cartilage from
anterior ribs underlie the area of palpable concern.

Targeted left breast ultrasound is performed, showing normal
fibrofatty and scattered fibroglandular tissue in the lower inner
quadrant in the area of palpable concern. A particularly prominent
costal cartilage from an anterior rib underlies the area of palpable
concern.
IMPRESSION: No mammographic or sonographic evidence of malignancy involving
either breast. Underlying the areas of palpable concern in both
breasts is prominent costal cartilage from anterior ribs.

RECOMMENDATION:
Screening mammogram in one year.(Code:IT-C-NZ1)

I have discussed the findings and recommendations with the patient.
Results were also provided in writing at the conclusion of the
visit. If applicable, a reminder letter will be sent to the patient
regarding the next appointment.

BI-RADS CATEGORY  1: Negative.

## 2018-04-14 IMAGING — US US BREAST*R* LIMITED INC AXILLA
1 series · 8 of 8 positions shown · non-contrast
Comparison: Mammography 12/28/2014 (bilateral), 12/23/2013 (left),
12/19/2013 (bilateral) and earlier.

CLINICAL DATA: Possible palpable lumps in the lower inner portions
of both breasts on recent clinical breast examination.

EXAM:
2D DIGITAL DIAGNOSTIC BILATERAL MAMMOGRAM WITH CAD AND ADJUNCT TOMO
LIMITED ULTRASOUND BILATERAL BREASTS

[Series 1: us breast*right* limited inc axilla · 0.07mm/px · 8 of 8 slices shown]
[im 1/8]
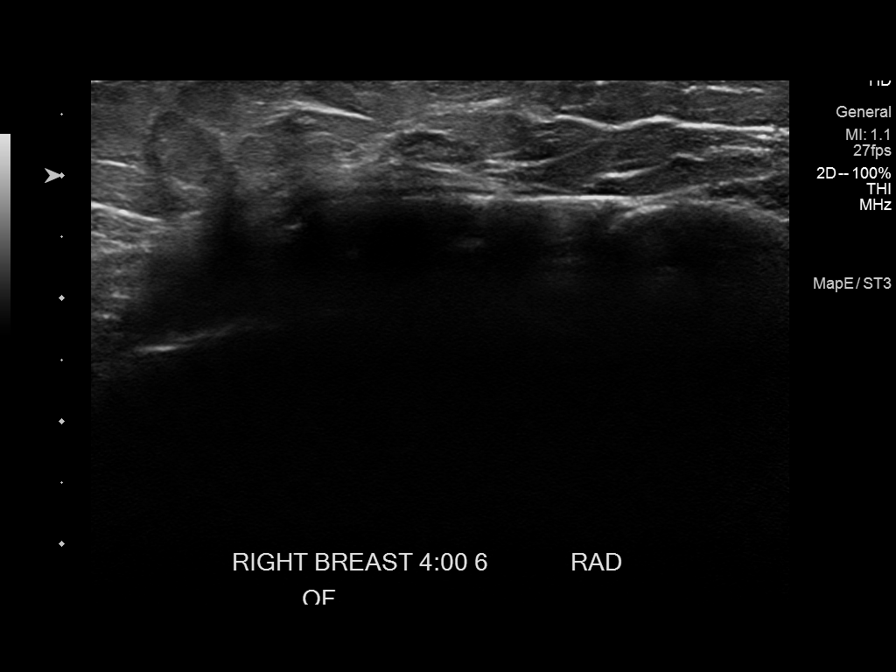
[im 2/8]
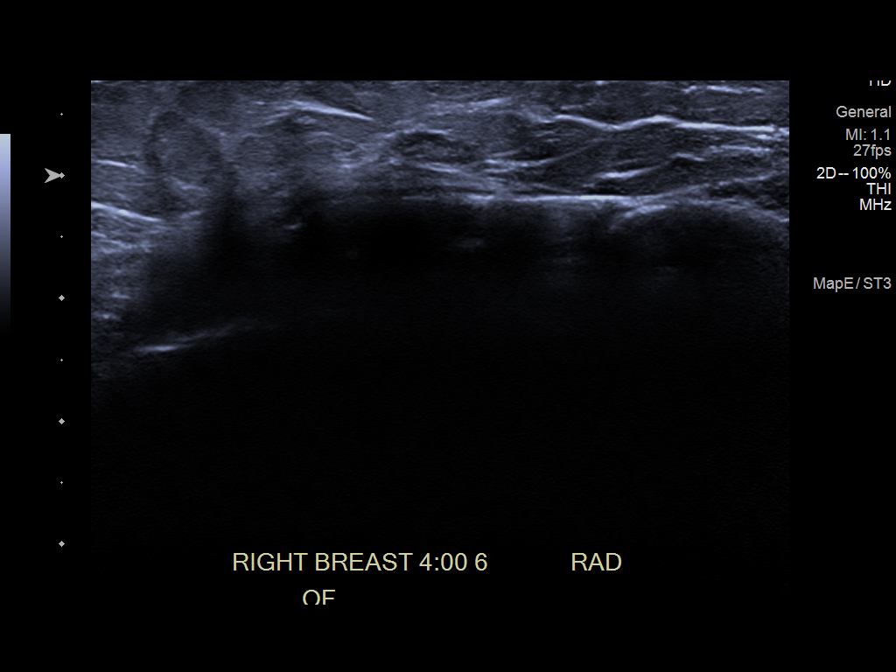
[im 3/8]
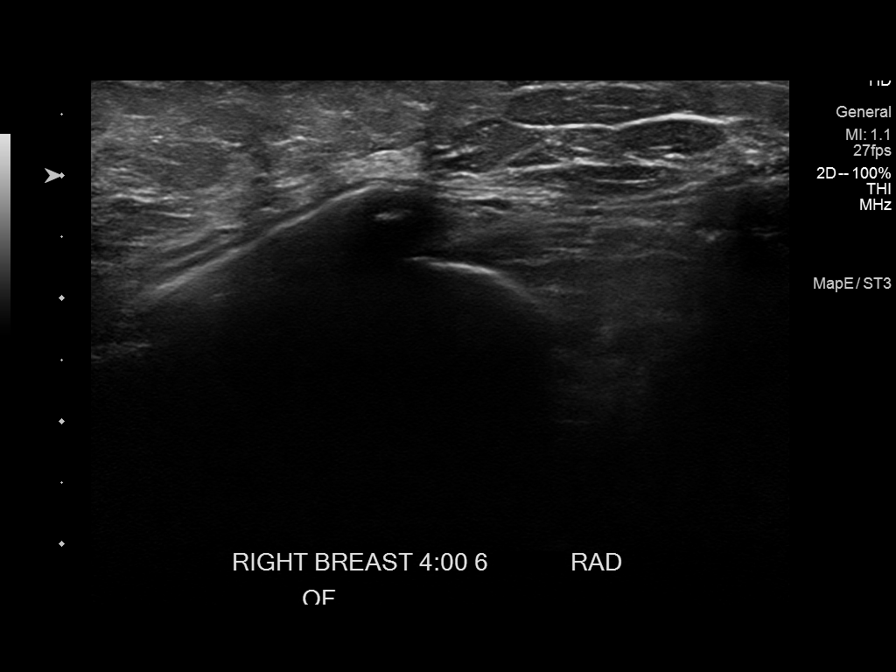
[im 4/8]
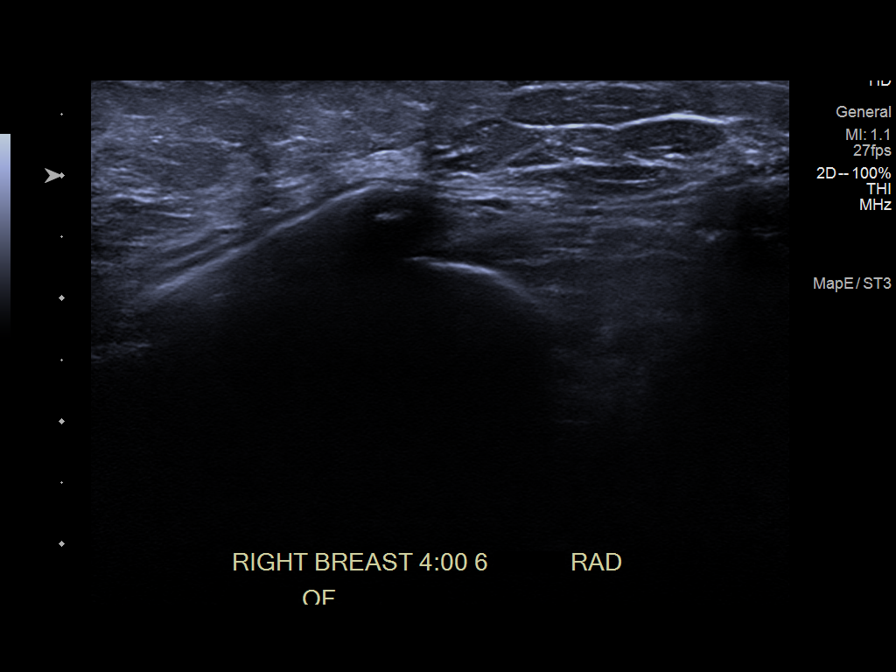
[im 5/8]
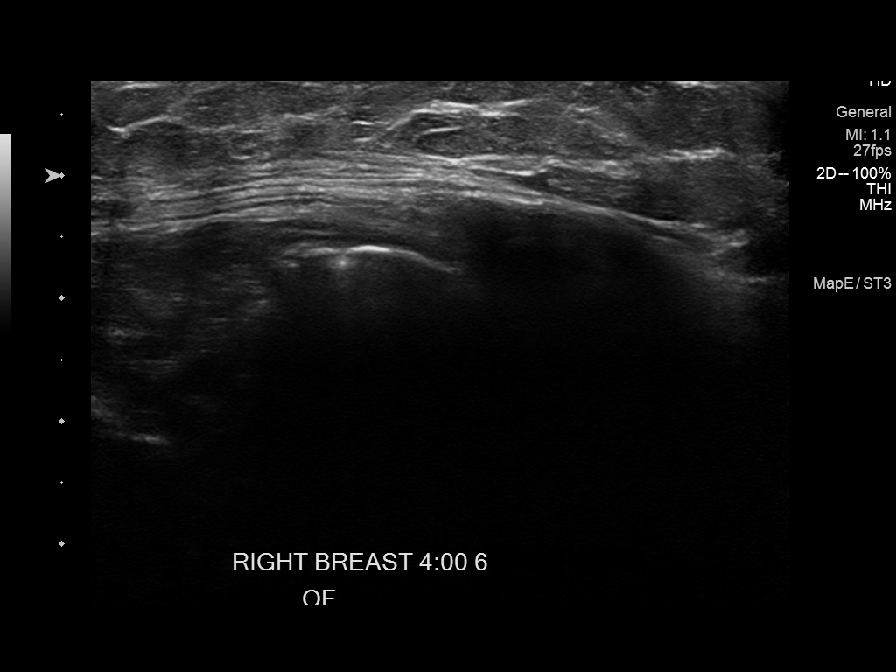
[im 6/8]
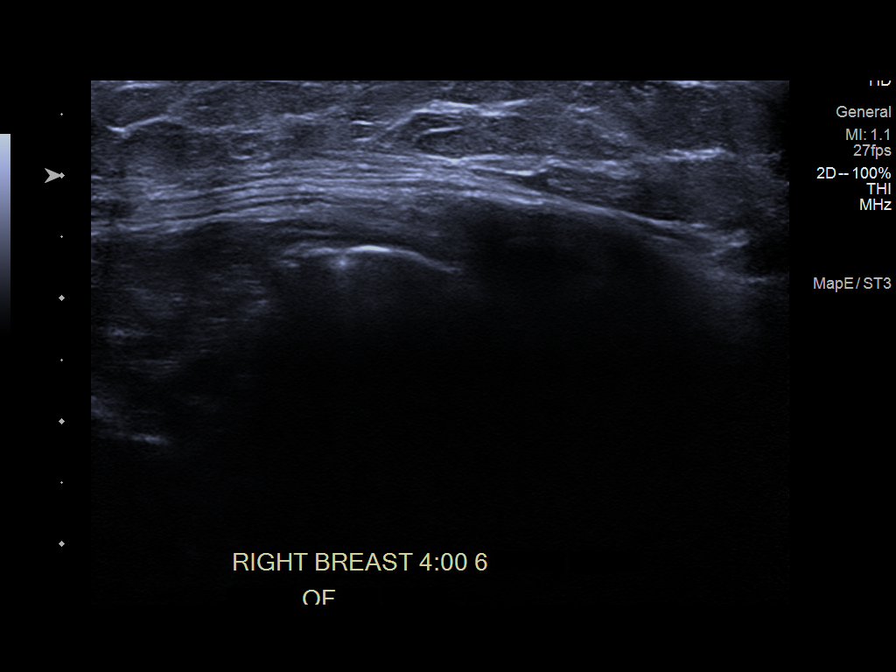
[im 7/8]
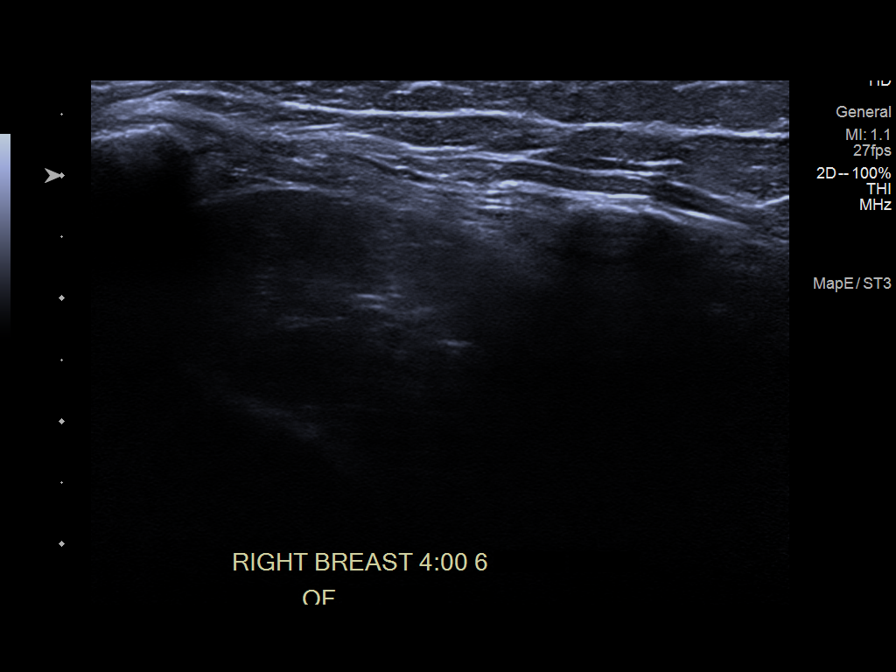
[im 8/8]
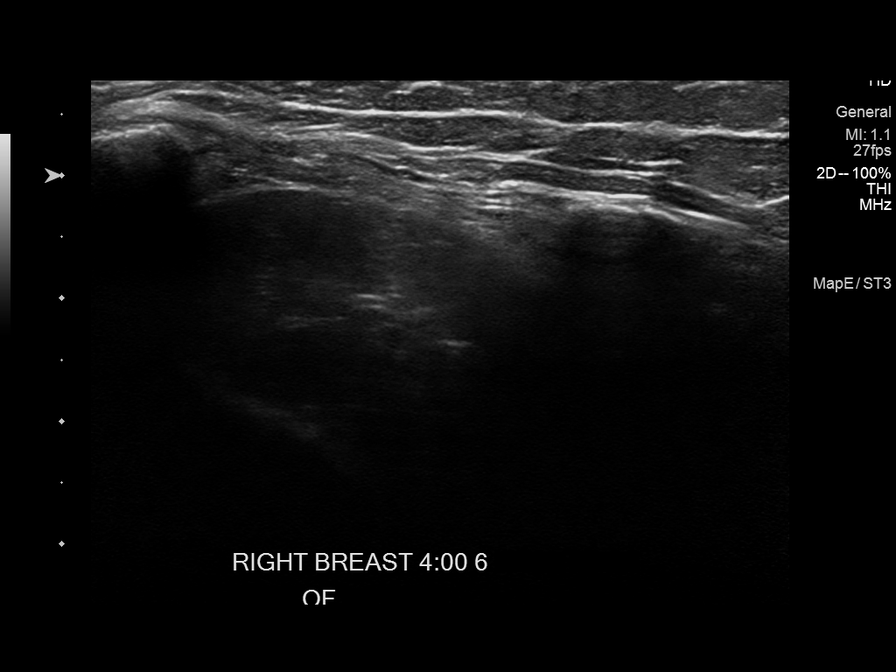

[8 of 8 positions shown; findings below may reference images not displayed]

No prior ultrasound.

ACR Breast Density Category c: The breast tissue is heterogeneously
dense, which may obscure small masses.
FINDINGS: Standard 2D and tomosynthesis full field CC and MLO views of both
breasts were obtained. Standard and tomosynthesis spot tangential
views of the areas of palpable concern in both breasts were also
obtained.

No mammographic abnormality in the area of palpable concern in the
lower inner right breast. No findings suspicious for malignancy in
the right breast.

No mammographic abnormality in the area palpable concern in the
lower inner left breast. No findings suspicious malignancy in the
left breast.

Mammographic images were processed with CAD.

On physical exam, the patient has prominent costal cartilage
bilaterally in the areas of palpable concern in the lower inner
portions of both breasts.

Targeted right breast ultrasound is performed, showing normal
fibrofatty and scattered fibroglandular tissue in the lower inner
quadrant in the area of palpable concern. Costal cartilage from
anterior ribs underlie the area of palpable concern.

Targeted left breast ultrasound is performed, showing normal
fibrofatty and scattered fibroglandular tissue in the lower inner
quadrant in the area of palpable concern. A particularly prominent
costal cartilage from an anterior rib underlies the area of palpable
concern.
IMPRESSION: No mammographic or sonographic evidence of malignancy involving
either breast. Underlying the areas of palpable concern in both
breasts is prominent costal cartilage from anterior ribs.

RECOMMENDATION:
Screening mammogram in one year.(Code:IT-C-NZ1)

I have discussed the findings and recommendations with the patient.
Results were also provided in writing at the conclusion of the
visit. If applicable, a reminder letter will be sent to the patient
regarding the next appointment.

BI-RADS CATEGORY  1: Negative.

## 2018-04-17 ENCOUNTER — Telehealth: Payer: Self-pay

## 2018-04-17 NOTE — Progress Notes (Signed)
Donna Cochran  Let patient know she is immune for MMR per titer.   Thanks, Laverna Peace MSN, AGNP-C, FNP-C

## 2018-04-17 NOTE — Telephone Encounter (Signed)
Patient notified that MMR status is immune.

## 2018-04-19 ENCOUNTER — Telehealth: Payer: Self-pay

## 2018-12-03 ENCOUNTER — Other Ambulatory Visit: Payer: Self-pay | Admitting: Obstetrics and Gynecology

## 2018-12-03 DIAGNOSIS — Z1231 Encounter for screening mammogram for malignant neoplasm of breast: Secondary | ICD-10-CM

## 2019-01-08 ENCOUNTER — Ambulatory Visit
Admission: RE | Admit: 2019-01-08 | Discharge: 2019-01-08 | Disposition: A | Discharge status: Home / Self Care | Payer: BC Managed Care – PPO | Source: Ambulatory Visit | Attending: Obstetrics and Gynecology | Admitting: Obstetrics and Gynecology

## 2019-01-08 DIAGNOSIS — Z1231 Encounter for screening mammogram for malignant neoplasm of breast: Secondary | ICD-10-CM | POA: Insufficient documentation

## 2019-06-21 ENCOUNTER — Ambulatory Visit: Payer: Self-pay | Attending: Internal Medicine

## 2019-06-21 DIAGNOSIS — Z23 Encounter for immunization: Secondary | ICD-10-CM | POA: Insufficient documentation

## 2019-06-21 NOTE — Progress Notes (Signed)
   Covid-19 Vaccination Clinic  Name:  Donna Cochran    MRN: [id number] DOB: May 12, 1955  06/21/2019  Ms. Donna Cochran was observed post Covid-19 immunization for 15 minutes without incidence. She was provided with Vaccine Information Sheet and instruction to access the V-Safe system.   Ms. Donna Cochran was instructed to call 911 with any severe reactions post vaccine: Marland Kitchen Difficulty breathing  . Swelling of your face and throat  . A fast heartbeat  . A bad rash all over your body  . Dizziness and weakness    Immunizations Administered    Name Date Dose VIS Date Route   Pfizer COVID-19 Vaccine 06/21/2019  1:07 PM 0.3 mL 04/11/2019 Intramuscular   Manufacturer: [place]   Lot: [id number]   [place]: [id number]

## 2019-07-15 ENCOUNTER — Ambulatory Visit: Payer: Self-pay | Attending: Internal Medicine

## 2019-07-15 DIAGNOSIS — Z23 Encounter for immunization: Secondary | ICD-10-CM

## 2019-07-15 NOTE — Progress Notes (Signed)
   Covid-19 Vaccination Clinic  Name:  China Gaut    MRN: [id number] DOB: [birth year]  07/15/2019  Ms. Rayssa Stramel was observed post Covid-19 immunization for 15 minutes without incident. She was provided with Vaccine Information Sheet and instruction to access the V-Safe system.   Ms. Korie Busick was instructed to call 911 with any severe reactions post vaccine: Marland Kitchen Difficulty breathing  . Swelling of face and throat  . A fast heartbeat  . A bad rash all over body  . Dizziness and weakness   Immunizations Administered    Name Date Dose VIS Date Route   Pfizer COVID-19 Vaccine 07/15/2019  1:03 PM 0.3 mL 04/11/2019 Intramuscular   Manufacturer: [place]   Lot: [id number]   [place]: [id number]

## 2019-11-18 ENCOUNTER — Other Ambulatory Visit: Payer: Self-pay | Admitting: Obstetrics and Gynecology

## 2019-11-18 DIAGNOSIS — Z1231 Encounter for screening mammogram for malignant neoplasm of breast: Secondary | ICD-10-CM

## 2019-12-08 ENCOUNTER — Other Ambulatory Visit: Payer: Self-pay

## 2019-12-08 ENCOUNTER — Ambulatory Visit
Admission: RE | Admit: 2019-12-08 | Discharge: 2019-12-08 | Disposition: A | Discharge status: Home / Self Care | Payer: BC Managed Care – PPO | Source: Ambulatory Visit | Attending: Obstetrics and Gynecology | Admitting: Obstetrics and Gynecology

## 2019-12-08 DIAGNOSIS — Z1231 Encounter for screening mammogram for malignant neoplasm of breast: Secondary | ICD-10-CM | POA: Insufficient documentation

## 2020-03-22 ENCOUNTER — Ambulatory Visit: Payer: BC Managed Care – PPO | Attending: Internal Medicine

## 2020-03-22 DIAGNOSIS — Z23 Encounter for immunization: Secondary | ICD-10-CM

## 2020-03-22 NOTE — Progress Notes (Signed)
   Covid-19 Vaccination Clinic  Name:  Donna Cochran    MRN: [id number] DOB: [birth year]  03/22/2020  Ms. Donna Cochran was observed post Covid-19 immunization for 15 minutes without incident. She was provided with Vaccine Information Sheet and instruction to access the V-Safe system.   Ms. Donna Cochran was instructed to call 911 with any severe reactions post vaccine: Marland Kitchen Difficulty breathing  . Swelling of face and throat  . A fast heartbeat  . A bad rash all over body  . Dizziness and weakness   Immunizations Administered    Name Date Dose VIS Date Route   Pfizer COVID-19 Vaccine 03/22/2020  1:04 PM 0.3 mL 02/18/2020 Intramuscular   Manufacturer: [place]   Lot: [id number]   [place]: [id number]

## 2020-04-08 ENCOUNTER — Other Ambulatory Visit: Payer: Self-pay

## 2020-04-08 ENCOUNTER — Encounter: Payer: Self-pay | Admitting: Otolaryngology

## 2020-04-13 ENCOUNTER — Other Ambulatory Visit: Payer: Self-pay

## 2020-04-13 ENCOUNTER — Other Ambulatory Visit
Admission: RE | Admit: 2020-04-13 | Discharge: 2020-04-13 | Disposition: A | Discharge status: Home / Self Care | Payer: BC Managed Care – PPO | Source: Ambulatory Visit | Attending: Otolaryngology | Admitting: Otolaryngology

## 2020-04-13 DIAGNOSIS — J342 Deviated nasal septum: Secondary | ICD-10-CM | POA: Diagnosis present

## 2020-04-13 DIAGNOSIS — M950 Acquired deformity of nose: Secondary | ICD-10-CM | POA: Diagnosis not present

## 2020-04-13 DIAGNOSIS — Z20822 Contact with and (suspected) exposure to covid-19: Secondary | ICD-10-CM | POA: Insufficient documentation

## 2020-04-13 DIAGNOSIS — J343 Hypertrophy of nasal turbinates: Secondary | ICD-10-CM | POA: Diagnosis not present

## 2020-04-13 DIAGNOSIS — Z01812 Encounter for preprocedural laboratory examination: Secondary | ICD-10-CM | POA: Insufficient documentation

## 2020-04-13 DIAGNOSIS — Z87891 Personal history of nicotine dependence: Secondary | ICD-10-CM | POA: Diagnosis not present

## 2020-04-13 LAB — SARS CORONAVIRUS 2 (TAT 6-24 HRS): SARS Coronavirus 2: NEGATIVE

## 2020-04-13 NOTE — Discharge Instructions (Signed)
[facility] ENDOSCOPIC SINUS SURGERY  [facility]  What is Functional Endoscopic Sinus Surgery?  The Surgery involves making the natural openings of the sinuses larger by removing the bony partitions that separate the sinuses from the nasal cavity.  The natural sinus lining is preserved as much as possible to allow the sinuses to resume normal function after the surgery.  In some patients nasal polyps (excessively swollen lining of the sinuses) may be removed to relieve obstruction of the sinus openings.  The surgery is performed through the nose using lighted scopes, which eliminates the need for incisions on the face.  A septoplasty is a different procedure which is sometimes performed with sinus surgery.  It involves straightening the boy partition that separates the two sides of your nose.  A crooked or deviated septum may need repair if is obstructing the sinuses or nasal airflow.  Turbinate reduction is also often performed during sinus surgery.  The turbinates are bony proturberances from the side walls of the nose which swell and can obstruct the nose in patients with sinus and allergy problems.  Their size can be surgically reduced to help relieve nasal obstruction.  What Can Sinus Surgery Do For Me?  Sinus surgery can reduce the frequency of sinus infections requiring antibiotic treatment.  This can provide improvement in nasal congestion, post-nasal drainage, facial pressure and nasal obstruction.  Surgery will NOT prevent you from ever having an infection again, so it usually only for patients who get infections 4 or more times yearly requiring antibiotics, or for infections that do not clear with antibiotics.  It will not cure nasal allergies, so patients with allergies may still require medication to treat their allergies after surgery. Surgery may improve headaches related to sinusitis, however, some people will continue to  require medication to control sinus headaches related to allergies.  Surgery will do nothing for other forms of headache (migraine, tension or cluster).  What Are the Risks of Endoscopic Sinus Surgery?  Current techniques allow surgery to be performed safely with little risk, however, there are rare complications that patients should be aware of.  Because the sinuses are located around the eyes, there is risk of eye injury, including blindness, though again, this would be quite rare. This is usually a result of bleeding behind the eye during surgery, which puts the vision oat risk, though there are treatments to protect the vision and prevent permanent disrupted by surgery causing a leak of the spinal fluid that surrounds the brain.  More serious complications would include bleeding inside the brain cavity or damage to the brain.  Again, all of these complications are uncommon, and spinal fluid leaks can be safely managed surgically if they occur.  The most common complication of sinus surgery is bleeding from the nose, which may require packing or cauterization of the nose.  Continued sinus have polyps may experience recurrence of the polyps requiring revision surgery.  Alterations of sense of smell or injury to the tear ducts are also rare complications.   What is the Surgery Like, and what is the Recovery?  The Surgery usually takes a couple of hours to perform, and is usually performed under a general anesthetic (completely asleep).  Patients are usually discharged home after a couple of hours.  Sometimes during surgery it is necessary to pack the nose to control bleeding, and the packing is left in place for 24 - 48 hours, and removed by your surgeon.    If a septoplasty was performed during the procedure, there is often a splint placed which must be removed after 5-7 days.   Discomfort: Pain is usually mild to moderate, and can be controlled by prescription pain medication or acetaminophen (Tylenol).   Aspirin, Ibuprofen (Advil, Motrin), or Naprosyn (Aleve) should be avoided, as they can cause increased bleeding.  Most patients feel sinus pressure like they have a bad head cold for several days.  Sleeping with your head elevated can help reduce swelling and facial pressure, as can ice packs over the face.  A humidifier may be helpful to keep the mucous and blood from drying in the nose.   Diet: There are no specific diet restrictions, however, you should generally start with clear liquids and a light diet of bland foods because the anesthetic can cause some nausea.  Advance your diet depending on how your stomach feels.  Taking your pain medication with food will often help reduce stomach upset which pain medications can cause.  Nasal Saline Irrigation: It is important to remove blood clots and dried mucous from the nose as it is healing.  This is done by having you irrigate the nose at least 3 - 4 times daily with a salt water solution.  We recommend using NeilMed Sinus Rinse (available at the drug store).  Fill the squeeze bottle with the solution, bend over a sink, and insert the tip of the squeeze bottle into the nose  of an inch.  Point the tip of the squeeze bottle towards the inside corner of the eye on the same side your irrigating.  Squeeze the bottle and gently irrigate the nose.  If you bend forward as you do this, most of the fluid will flow back out of the nose, instead of down your throat.   The solution should be warm, near body temperature, when you irrigate.   Each time you irrigate, you should use a full squeeze bottle.   Note that if you are instructed to use Nasal Steroid Sprays at any time after your surgery, irrigate with saline BEFORE using the steroid spray, so you do not wash it all out of the nose. Another product, Nasal Saline Gel (such as AYR Nasal Saline Gel) can be applied in each nostril 3 - 4 times daily to moisture the nose and reduce scabbing or crusting.  Bleeding:   Bloody drainage from the nose can be expected for several days, and patients are instructed to irrigate their nose frequently with salt water to help remove mucous and blood clots.  The drainage may be dark red or brown, though some fresh blood may be seen intermittently, especially after irrigation.  Do not blow you nose, as bleeding may occur. If you must sneeze, keep your mouth open to allow air to escape through your mouth.  If heavy bleeding occurs: Irrigate the nose with saline to rinse out clots, then spray the nose 3 - 4 times with Afrin Nasal Decongestant Spray.  The spray will constrict the blood vessels to slow bleeding.  Pinch the lower half of your nose shut to apply pressure, and lay down with your head elevated.  Ice packs over the nose may help as well. If bleeding persists despite these measures, you should notify your doctor.  Do not use the Afrin routinely to control nasal congestion after surgery, as it can result in worsening congestion and may affect healing.     Activity: Return to work varies among patients. Most patients will be   out of work at least 5 - 7 days to recover.  Patient may return to work after they are off of narcotic pain medication, and feeling well enough to perform the functions of their job.  Patients must avoid heavy lifting (over 10 pounds) or strenuous physical for 2 weeks after surgery, so your employer may need to assign you to light duty, or keep you out of work longer if light duty is not possible.  NOTE: you should not drive, operate dangerous machinery, do any mentally demanding tasks or make any important legal or financial decisions while on narcotic pain medication and recovering from the general anesthetic.    Call Your Doctor Immediately if You Have Any of the Following: 1. Bleeding that you cannot control with the above measures 2. Loss of vision, double vision, bulging of the eye or black eyes. 3. Fever over 101 degrees 4. Neck stiffness with  severe headache, fever, nausea and change in mental state. You are always encourage to call anytime with concerns, however, please call with requests for pain medication refills during office hours.  Office Endoscopy: During follow-up visits your doctor will remove any packing or splints that may have been placed and evaluate and clean your sinuses endoscopically.  Topical anesthetic will be used to make this as comfortable as possible, though you may want to take your pain medication prior to the visit.  How often this will need to be done varies from patient to patient.  After complete recovery from the surgery, you may need follow-up endoscopy from time to time, particularly if there is concern of recurrent infection or nasal polyps.  General Anesthesia, Adult, Care After This sheet gives you information about how to care for yourself after your procedure. Your health care provider may also give you more specific instructions. If you have problems or questions, contact your health care provider. What can I expect after the procedure? After the procedure, the following side effects are common:  Pain or discomfort at the IV site.  Nausea.  Vomiting.  Sore throat.  Trouble concentrating.  Feeling cold or chills.  Weak or tired.  Sleepiness and fatigue.  Soreness and body aches. These side effects can affect parts of the body that were not involved in surgery. Follow these instructions at home:  For at least 24 hours after the procedure:  Have a responsible adult stay with you. It is important to have someone help care for you until you are awake and alert.  Rest as needed.  Do not: ? Participate in activities in which you could fall or become injured. ? Drive. ? Use heavy machinery. ? Drink alcohol. ? Take sleeping pills or medicines that cause drowsiness. ? Make important decisions or sign legal documents. ? Take care of children on your own. Eating and drinking  Follow  any instructions from your health care provider about eating or drinking restrictions.  When you feel hungry, start by eating small amounts of foods that are soft and easy to digest (bland), such as toast. Gradually return to your regular diet.  Drink enough fluid to keep your urine pale yellow.  If you vomit, rehydrate by drinking water, juice, or clear broth. General instructions  If you have sleep apnea, surgery and certain medicines can increase your risk for breathing problems. Follow instructions from your health care provider about wearing your sleep device: ? Anytime you are sleeping, including during daytime naps. ? While taking prescription pain medicines, sleeping medicines, or medicines that   make you drowsy.  Return to your normal activities as told by your health care provider. Ask your health care provider what activities are safe for you.  Take over-the-counter and prescription medicines only as told by your health care provider.  If you smoke, do not smoke without supervision.  Keep all follow-up visits as told by your health care provider. This is important. Contact a health care provider if:  You have nausea or vomiting that does not get better with medicine.  You cannot eat or drink without vomiting.  You have pain that does not get better with medicine.  You are unable to pass urine.  You develop a skin rash.  You have a fever.  You have redness around your IV site that gets worse. Get help right away if:  You have difficulty breathing.  You have chest pain.  You have blood in your urine or stool, or you vomit blood. Summary  After the procedure, it is common to have a sore throat or nausea. It is also common to feel tired.  Have a responsible adult stay with you for the first 24 hours after general anesthesia. It is important to have someone help care for you until you are awake and alert.  When you feel hungry, start by eating small amounts of  foods that are soft and easy to digest (bland), such as toast. Gradually return to your regular diet.  Drink enough fluid to keep your urine pale yellow.  Return to your normal activities as told by your health care provider. Ask your health care provider what activities are safe for you. This information is not intended to replace advice given to you by your health care provider. Make sure you discuss any questions you have with your health care provider. Document Revised: 04/20/2017 Document Reviewed: 12/01/2016 Elsevier Patient Education  2020 [employer]  

## 2020-04-15 ENCOUNTER — Encounter
Admission: RE | Disposition: A | Discharge status: Home / Self Care | Payer: Self-pay | Source: Home / Self Care | Attending: Otolaryngology

## 2020-04-15 ENCOUNTER — Ambulatory Visit: Payer: BC Managed Care – PPO | Admitting: Anesthesiology

## 2020-04-15 ENCOUNTER — Encounter: Payer: Self-pay | Admitting: Otolaryngology

## 2020-04-15 ENCOUNTER — Ambulatory Visit
Admission: RE | Admit: 2020-04-15 | Discharge: 2020-04-15 | Disposition: A | Discharge status: Home / Self Care | Payer: BC Managed Care – PPO | Attending: Otolaryngology | Admitting: Otolaryngology

## 2020-04-15 ENCOUNTER — Other Ambulatory Visit: Payer: Self-pay

## 2020-04-15 DIAGNOSIS — Z20822 Contact with and (suspected) exposure to covid-19: Secondary | ICD-10-CM | POA: Insufficient documentation

## 2020-04-15 DIAGNOSIS — J342 Deviated nasal septum: Secondary | ICD-10-CM | POA: Insufficient documentation

## 2020-04-15 DIAGNOSIS — J343 Hypertrophy of nasal turbinates: Secondary | ICD-10-CM | POA: Insufficient documentation

## 2020-04-15 DIAGNOSIS — Z87891 Personal history of nicotine dependence: Secondary | ICD-10-CM | POA: Insufficient documentation

## 2020-04-15 DIAGNOSIS — M950 Acquired deformity of nose: Secondary | ICD-10-CM | POA: Insufficient documentation

## 2020-04-15 HISTORY — DX: Acquired absence of kidney: Z90.5

## 2020-04-15 HISTORY — PX: TURBINATE REDUCTION: SHX6157

## 2020-04-15 HISTORY — PX: SEPTOPLASTY: SHX2393

## 2020-04-15 SURGERY — SEPTOPLASTY, NOSE
Anesthesia: General | Site: Nose | Laterality: Right

## 2020-04-15 MED ORDER — ACETAMINOPHEN 160 MG/5ML PO SOLN: 325.0000 mg | ORAL | Status: DC | PRN

## 2020-04-15 MED ORDER — LIDOCAINE HCL (CARDIAC) PF 100 MG/5ML IV SOSY
PREFILLED_SYRINGE | INTRAVENOUS | Status: DC | PRN
  Administered 2020-04-15: 50 mg via INTRAVENOUS

## 2020-04-15 MED ORDER — ONDANSETRON HCL 4 MG/2ML IJ SOLN
INTRAMUSCULAR | Status: DC | PRN
  Administered 2020-04-15: 4 mg via INTRAVENOUS

## 2020-04-15 MED ORDER — FAMOTIDINE IN NACL 20-0.9 MG/50ML-% IV SOLN
20.0000 mg | Freq: Once | INTRAVENOUS | Status: AC
  Administered 2020-04-15: 09:00:00 20 mg via INTRAVENOUS

## 2020-04-15 MED ORDER — HYDROMORPHONE HCL 1 MG/ML IJ SOLN: 0.2500 mg | INTRAMUSCULAR | Status: DC | PRN

## 2020-04-15 MED ORDER — PROPOFOL 10 MG/ML IV BOLUS
INTRAVENOUS | Status: DC | PRN
  Administered 2020-04-15: 120 mg via INTRAVENOUS

## 2020-04-15 MED ORDER — OXYMETAZOLINE HCL 0.05 % NA SOLN
2.0000 | Freq: Once | NASAL | Status: AC
  Administered 2020-04-15: 10:00:00 2 via NASAL

## 2020-04-15 MED ORDER — DEXTROSE 5 % IV SOLN
2000.0000 mg | Freq: Once | INTRAVENOUS | Status: AC
  Administered 2020-04-15: 10:00:00 2000 mg via INTRAVENOUS

## 2020-04-15 MED ORDER — PREDNISONE 10 MG PO TABS: ORAL_TABLET | ORAL | 0 refills | Status: DC

## 2020-04-15 MED ORDER — SUCCINYLCHOLINE CHLORIDE 20 MG/ML IJ SOLN
INTRAMUSCULAR | Status: DC | PRN
  Administered 2020-04-15: 80 mg via INTRAVENOUS

## 2020-04-15 MED ORDER — CEPHALEXIN 500 MG PO CAPS: 500.0000 mg | ORAL_CAPSULE | Freq: Two times a day (BID) | ORAL | 0 refills | Status: DC

## 2020-04-15 MED ORDER — DEXAMETHASONE SODIUM PHOSPHATE 4 MG/ML IJ SOLN
INTRAMUSCULAR | Status: DC | PRN
  Administered 2020-04-15: 10 mg via INTRAVENOUS

## 2020-04-15 MED ORDER — ONDANSETRON HCL 4 MG/2ML IJ SOLN: 4.0000 mg | Freq: Once | INTRAMUSCULAR | Status: DC | PRN

## 2020-04-15 MED ORDER — OXYCODONE HCL 5 MG/5ML PO SOLN
5.0000 mg | Freq: Once | ORAL | Status: DC | PRN
Start: 2020-04-15 — End: 2020-04-15

## 2020-04-15 MED ORDER — LIDOCAINE-EPINEPHRINE 1 %-1:100000 IJ SOLN
INTRAMUSCULAR | Status: DC | PRN
  Administered 2020-04-15: 6 mL
  Administered 2020-04-15: 2.75 mL

## 2020-04-15 MED ORDER — PHENYLEPHRINE HCL 0.5 % NA SOLN
NASAL | Status: DC | PRN
  Administered 2020-04-15: 11:00:00 5 mL via TOPICAL

## 2020-04-15 MED ORDER — HYDROCODONE-ACETAMINOPHEN 5-325 MG PO TABS: 1.0000 | ORAL_TABLET | Freq: Four times a day (QID) | ORAL | 0 refills | Status: AC | PRN

## 2020-04-15 MED ORDER — FENTANYL CITRATE (PF) 100 MCG/2ML IJ SOLN
INTRAMUSCULAR | Status: DC | PRN
  Administered 2020-04-15: 50 ug via INTRAVENOUS

## 2020-04-15 MED ORDER — GLYCOPYRROLATE 0.2 MG/ML IJ SOLN
INTRAMUSCULAR | Status: DC | PRN
  Administered 2020-04-15: .1 mg via INTRAVENOUS

## 2020-04-15 MED ORDER — ACETAMINOPHEN 325 MG PO TABS: 325.0000 mg | ORAL_TABLET | ORAL | Status: DC | PRN

## 2020-04-15 MED ORDER — LACTATED RINGERS IV SOLN: INTRAVENOUS | Status: DC

## 2020-04-15 MED ORDER — OXYCODONE HCL 5 MG PO TABS: 5.0000 mg | ORAL_TABLET | Freq: Once | ORAL | Status: DC | PRN

## 2020-04-15 MED ORDER — MIDAZOLAM HCL 5 MG/5ML IJ SOLN
INTRAMUSCULAR | Status: DC | PRN
  Administered 2020-04-15: 2 mg via INTRAVENOUS

## 2020-04-15 SURGICAL SUPPLY — 34 items
CANISTER SUCT 1200ML W/VALVE (MISCELLANEOUS) ×5 IMPLANT
CATH IV 18X1 1/4 SAFELET (CATHETERS) ×5 IMPLANT
COAGULATOR SUCT 8FR VV (MISCELLANEOUS) ×5 IMPLANT
DRAPE HEAD BAR (DRAPES) ×5 IMPLANT
ELECT REM PT RETURN 9FT ADLT (ELECTROSURGICAL) ×5
ELECTRODE REM PT RTRN 9FT ADLT (ELECTROSURGICAL) ×3 IMPLANT
GEL ULTRASOUND 20GR AQUASONIC (MISCELLANEOUS) ×5 IMPLANT
GLOVE PI ULTRA LF STRL 7.5 (GLOVE) ×6 IMPLANT
GLOVE PI ULTRA NON LATEX 7.5 (GLOVE) ×4
GOWN STRL REUS W/ TWL LRG LVL3 (GOWN DISPOSABLE) ×3 IMPLANT
GOWN STRL REUS W/TWL LRG LVL3 (GOWN DISPOSABLE) ×5
IV CATH 18X1 1/4 SAFELET (CATHETERS) ×3
KIT TURNOVER KIT A (KITS) ×5 IMPLANT
NEEDLE ANESTHESIA  27G X 3.5 (NEEDLE) ×2
NEEDLE ANESTHESIA 27G X 3.5 (NEEDLE) ×3 IMPLANT
NEEDLE HYPO 25GX1X1/2 BEV (NEEDLE) ×5 IMPLANT
NEEDLE HYPO 27GX1-1/4 (NEEDLE) ×5 IMPLANT
PACK ENT CUSTOM (PACKS) ×5 IMPLANT
PACKING NASAL EPIS 4X2.4 XEROG (MISCELLANEOUS) IMPLANT
PATTIES SURGICAL .5 X3 (DISPOSABLE) ×5 IMPLANT
SOL ANTI-FOG 6CC FOG-OUT (MISCELLANEOUS) ×3 IMPLANT
SOL FOG-OUT ANTI-FOG 6CC (MISCELLANEOUS) ×2
SPLINT NASAL SEPTAL BLV .50 ST (MISCELLANEOUS) ×5 IMPLANT
STRAP BODY AND KNEE 60X3 (MISCELLANEOUS) ×10 IMPLANT
STYLUS VIVAER (MISCELLANEOUS) ×5
STYLUS VIVAER BP ELECT (MISCELLANEOUS) ×3 IMPLANT
SUT CHROMIC 3-0 (SUTURE)
SUT CHROMIC 3-0 KS 27XMFL CR (SUTURE)
SUT ETHILON 3-0 KS 30 BLK (SUTURE) ×5 IMPLANT
SUT PLAIN GUT 4-0 (SUTURE) ×5 IMPLANT
SUTURE CHRMC 3-0 KS 27XMFL CR (SUTURE) IMPLANT
SYR 3ML LL SCALE MARK (SYRINGE) ×5 IMPLANT
TOWEL OR 17X26 4PK STRL BLUE (TOWEL DISPOSABLE) ×5 IMPLANT
WATER STERILE IRR 250ML POUR (IV SOLUTION) ×5 IMPLANT

## 2020-04-15 NOTE — Anesthesia Preprocedure Evaluation (Signed)
Anesthesia Evaluation  Patient identified by MRN, date of birth, ID band Patient awake    Reviewed: Allergy & Precautions, H&P , NPO status , Patient's Chart, lab work & pertinent test results, reviewed documented beta blocker date and time   Airway Mallampati: I  TM Distance: >3 FB Neck ROM: full    Dental no notable dental hx.    Pulmonary former smoker,  PE in 2006   Pulmonary exam normal breath sounds clear to auscultation       Cardiovascular Exercise Tolerance: Good negative cardio ROS Normal cardiovascular exam Rhythm:regular Rate:Normal     Neuro/Psych negative neurological ROS  negative psych ROS   GI/Hepatic Neg liver ROS, Pt complaining of mild substernal discomfort today.  Says it seems to be related to burping/deep breathing.  VSS and does not seem cardiac related.     Endo/Other  negative endocrine ROS  Renal/GU negative Renal ROS  negative genitourinary   Musculoskeletal   Abdominal   Peds  Hematology negative hematology ROS (+)   Anesthesia Other Findings   Reproductive/Obstetrics negative OB ROS                             Anesthesia Physical Anesthesia Plan  ASA: II  Anesthesia Plan: General   Post-op Pain Management:    Induction:   PONV Risk Score and Plan:   Airway Management Planned:   Additional Equipment:   Intra-op Plan:   Post-operative Plan:   Informed Consent: I have reviewed the patients History and Physical, chart, labs and discussed the procedure including the risks, benefits and alternatives for the proposed anesthesia with the patient or authorized representative who has indicated his/her understanding and acceptance.     Dental Advisory Given  Plan Discussed with: CRNA and Anesthesiologist  Anesthesia Plan Comments:         Anesthesia Quick Evaluation

## 2020-04-15 NOTE — H&P (Signed)
H&P has been reviewed and patient reevaluated, no changes necessary. To be downloaded later.  

## 2020-04-15 NOTE — Op Note (Signed)
04/15/2020  11:40 AM [id number]   Pre-Op Dx:  Deviated Nasal Septum, Hypertrophic Right Inferior Turbinate, Nasal valve Collapse  Post-op Dx: Same  Proc: Nasal Septoplasty,  Partial Reduction Right Inferior Turbinate, Remodel nasal valves using Vivaer   Surg:  Donna Cochran  Anes:  GOT  EBL: 50 mL  Comp: None  Findings: Septum markedly deviated off the left side.  The right inferior turbinate was markedly enlarged where the left inferior turbinate was pinched off from the septum.  There was a small concha bullosa of the right middle turbinate that did not appear to need to be opened up.  The nasal valves were narrow and partially blocking her airway.  These were remodeled using Vivaer.   Procedure: With the patient in a comfortable supine position,  general orotracheal anesthesia was induced without difficulty.     The patient received preoperative Afrin spray for topical decongestion and vasoconstriction.  Intravenous prophylactic antibiotics were administered.  At an appropriate level, the patient was placed in a semi-sitting position.  Nasal vibrissae were trimmed.   1% Xylocaine with 1:100,000 epinephrine, 6 cc's, was infiltrated into the anterior floor of the nose, into the nasal spine region, into the membranous columella, and finally into the submucoperichondrial plane of the septum on both sides.  Several minutes were allowed for this to take effect.  Cottoniod pledgetts soaked in Afrin and 4% Xylocaine were placed into both nasal cavities and left while the patient was prepped and draped in the standard fashion.  The materials were removed from the nose and observed to be intact and correct in number.  The nose was inspected with a headlight and zero degree scope with the findings as described above.  A left Killian incision was sharply executed and carried down to the quadrangular cartilage. The mucoperichondrium was elelvated along the quadrangular plate back to the  bony-cartilaginous junction. The mucoperiostium was then elevated along the ethmoid plate and the vomer. The boney-catilaginous junction was then split with a freer elevator and the mucoperiosteum was elevated on the opposite side. The mucoperiosteum was then elevated along the maxillary crest as needed to expose the crooked bone of the crest.  Boney spurs of the vomer and maxillary crest were removed with Donavan Foil forceps.  The cartilaginous plate was trimmed along its posterior and inferior borders of about 2 mm of cartilage to free it up inferiorly. Some of the deviated ethmoid plate was then fractured and removed with Takahashi forceps to free up the posterior border of the quadrangular plate and allow it to swing back to the midline. The mucosal flaps were placed back into their anatomic position to allow visualization of the airways. The septum now sat in the midline with an improved airway.  A 3-0 Chromic suture on a Keith needle in used to anchor the inferior septum at the nasal spine with a through and through suture. The mucosal flaps are then sutured together using a through and through whip stitch of 4-0 Plain Gut with a mini-Keith needle. This was used to close the [place] incision as well.   The inferior turbinates were then inspected. An incision was created along the inferior aspect of the right inferior turbinate with removal of some of the inferior soft tissue and bone. Electrocautery was used to control bleeding in the area. The remaining turbinate was then outfractured to open up the airway further. There was no significant bleeding noted. The left inferior turbinate was visualized and this did not require any trimming.  The airways were then visualized and showed open passageways on both sides that were significantly improved compared to before surgery. There was no signifcant bleeding. Nasal splints were applied to both sides of the septum using Xomed 0.47mm regular sized splints that  were trimmed, and then held in position with a 3-0 Nylon through and through suture.  The nasal valves were then injected on the lateral wall with approximately 1 mL of 1% Xylocaine with epi 1: 100,000.  The Vivaer machine was brought in and the handpiece was attached.  This was lubricated with gel.  He was placed into the left superior lateral nasal valve area and the power was applied.  There was an 18-second cycle of power followed by 12 seconds of cool down.  This was repeated 2 more times on the left nasal valve sequentially going down the valve on the lateral wall.  Each cycle was the same.  The right nasal valve was then treated on the lateral wall again starting superiorly and then more middle and then inferior.  Each was an 18-second cycle of power followed by 12 seconds of cool this helped to remodel and open the lateral wall of the nasal valve.   The patient was turned back over to anesthesia, and awakened, extubated, and taken to the PACU in satisfactory condition.  Dispo:   PACU to home  Plan: Ice, elevation, narcotic analgesia, steroid taper, and prophylactic antibiotics for the duration of indwelling nasal foreign bodies.  We will reevaluate the patient in the office in 6 days and remove the septal splints.  Return to work in 10 days, strenuous activities in two weeks.   Donna Cochran 04/15/2020 11:40 AM

## 2020-04-15 NOTE — Transfer of Care (Signed)
Immediate Anesthesia Transfer of Care Note  Patient: Pam Drown  Procedure(s) Performed: SEPTOPLASTY (Bilateral Nose) TURBINATE REDUCTION (Right Nose) NASAL VALVE REPAIR WITH NASAL IMPLANT (Bilateral Nose)  Patient Location: PACU  Anesthesia Type: General  Level of Consciousness: awake, alert  and patient cooperative  Airway and Oxygen Therapy: Patient Spontanous Breathing and Patient connected to supplemental oxygen  Post-op Assessment: Post-op Vital signs reviewed, Patient's Cardiovascular Status Stable, Respiratory Function Stable, Patent Airway and No signs of Nausea or vomiting  Post-op Vital Signs: Reviewed and stable  Complications: No complications documented.

## 2020-04-15 NOTE — Anesthesia Postprocedure Evaluation (Signed)
Anesthesia Post Note  Patient: Donna Cochran  Procedure(s) Performed: SEPTOPLASTY (Bilateral Nose) TURBINATE REDUCTION (Right Nose) NASAL VALVE REPAIR WITH NASAL IMPLANT (Bilateral Nose)     Patient location during evaluation: PACU Anesthesia Type: General Level of consciousness: awake and alert Pain management: pain level controlled Vital Signs Assessment: post-procedure vital signs reviewed and stable Respiratory status: spontaneous breathing, nonlabored ventilation, respiratory function stable and patient connected to nasal cannula oxygen Cardiovascular status: blood pressure returned to baseline and stable Postop Assessment: no apparent nausea or vomiting Anesthetic complications: no   No complications documented.  Trecia Rogers

## 2020-04-15 NOTE — Anesthesia Procedure Notes (Signed)
Procedure Name: Intubation Date/Time: 04/15/2020 10:32 AM Performed by: Mayme Genta, CRNA Pre-anesthesia Checklist: Patient identified, Emergency Drugs available, Suction available, Patient being monitored and Timeout performed Patient Re-evaluated:Patient Re-evaluated prior to induction Oxygen Delivery Method: Circle system utilized Preoxygenation: Pre-oxygenation with 100% oxygen Induction Type: IV induction Ventilation: Mask ventilation without difficulty Laryngoscope Size: Miller and 2 Grade View: Grade I Tube type: Oral Rae Tube size: 7.0 mm Number of attempts: 1 Placement Confirmation: ETT inserted through vocal cords under direct vision,  positive ETCO2 and breath sounds checked- equal and bilateral Tube secured with: Tape Dental Injury: Teeth and Oropharynx as per pre-operative assessment

## 2020-04-16 ENCOUNTER — Encounter: Payer: Self-pay | Admitting: Otolaryngology

## 2020-11-18 ENCOUNTER — Other Ambulatory Visit: Payer: Self-pay | Admitting: Obstetrics and Gynecology

## 2020-11-18 DIAGNOSIS — Z1231 Encounter for screening mammogram for malignant neoplasm of breast: Secondary | ICD-10-CM

## 2021-02-09 ENCOUNTER — Ambulatory Visit: Payer: Self-pay

## 2021-02-09 ENCOUNTER — Other Ambulatory Visit: Payer: Self-pay

## 2021-02-09 DIAGNOSIS — Z23 Encounter for immunization: Secondary | ICD-10-CM

## 2021-10-12 NOTE — Discharge Instructions (Signed)
[facility]  POST OPERATIVE INSTRUCTIONS FOR DR. FOWLER AND [facility]   Take your medication as prescribed.  Pain medication should be taken only as needed.  Keep the dressing clean, dry and intact.  Keep your foot elevated above the heart level for the first 48 hours.  Walking to the bathroom and brief periods of walking are acceptable, unless we have instructed you to be non-weight bearing.  Always wear your post-op shoe when walking.  Always use your crutches if you are to be non-weight bearing.  Do not take a shower. Baths are permissible as long as the foot is kept out of the water.   Every hour you are awake:  Bend your knee 15 times. Flex foot 15 times Massage calf 15 times  Call [facility] ([phone]) if any of the following problems occur: You develop a temperature or fever. The bandage becomes saturated with blood. Medication does not stop your pain. Injury of the foot occurs. Any symptoms of infection including redness, odor, or red streaks running from wound. 

## 2021-10-17 ENCOUNTER — Encounter: Payer: Self-pay | Admitting: Podiatry

## 2021-10-17 ENCOUNTER — Encounter: Payer: Self-pay | Admitting: Anesthesiology

## 2021-10-19 ENCOUNTER — Ambulatory Visit: Admission: RE | Admit: 2021-10-19 | Payer: BC Managed Care – PPO | Source: Home / Self Care | Admitting: Podiatry

## 2021-10-19 SURGERY — CORRECTION, HAMMER TOE
Anesthesia: Choice | Site: Toe | Laterality: Right

## 2022-02-07 ENCOUNTER — Other Ambulatory Visit: Payer: Self-pay | Admitting: Obstetrics and Gynecology

## 2022-02-07 DIAGNOSIS — Z1231 Encounter for screening mammogram for malignant neoplasm of breast: Secondary | ICD-10-CM

## 2022-03-01 ENCOUNTER — Encounter: Payer: Self-pay | Admitting: Emergency Medicine

## 2022-03-01 ENCOUNTER — Emergency Department: Payer: BC Managed Care – PPO

## 2022-03-01 ENCOUNTER — Emergency Department
Admission: EM | Admit: 2022-03-01 | Discharge: 2022-03-01 | Disposition: A | Discharge status: Home / Self Care | Payer: BC Managed Care – PPO | Attending: Emergency Medicine | Admitting: Emergency Medicine

## 2022-03-01 DIAGNOSIS — S60512A Abrasion of left hand, initial encounter: Secondary | ICD-10-CM | POA: Insufficient documentation

## 2022-03-01 DIAGNOSIS — Y9301 Activity, walking, marching and hiking: Secondary | ICD-10-CM | POA: Insufficient documentation

## 2022-03-01 DIAGNOSIS — W101XXA Fall (on)(from) sidewalk curb, initial encounter: Secondary | ICD-10-CM | POA: Insufficient documentation

## 2022-03-01 DIAGNOSIS — T07XXXA Unspecified multiple injuries, initial encounter: Secondary | ICD-10-CM

## 2022-03-01 DIAGNOSIS — S0181XA Laceration without foreign body of other part of head, initial encounter: Secondary | ICD-10-CM | POA: Diagnosis not present

## 2022-03-01 DIAGNOSIS — Y92480 Sidewalk as the place of occurrence of the external cause: Secondary | ICD-10-CM | POA: Insufficient documentation

## 2022-03-01 DIAGNOSIS — R6884 Jaw pain: Secondary | ICD-10-CM | POA: Insufficient documentation

## 2022-03-01 DIAGNOSIS — M25511 Pain in right shoulder: Secondary | ICD-10-CM | POA: Insufficient documentation

## 2022-03-01 DIAGNOSIS — S60511A Abrasion of right hand, initial encounter: Secondary | ICD-10-CM | POA: Diagnosis not present

## 2022-03-01 DIAGNOSIS — S0993XA Unspecified injury of face, initial encounter: Secondary | ICD-10-CM | POA: Diagnosis present

## 2022-03-01 DIAGNOSIS — W19XXXA Unspecified fall, initial encounter: Secondary | ICD-10-CM

## 2022-03-01 MED ORDER — ACETAMINOPHEN 500 MG PO TABS
1000.0000 mg | ORAL_TABLET | Freq: Once | ORAL | Status: AC
  Administered 2022-03-01: 1000 mg via ORAL
  Filled 2022-03-01: qty 2

## 2022-03-01 MED ORDER — TRAMADOL HCL 50 MG PO TABS: 50.0000 mg | ORAL_TABLET | Freq: Four times a day (QID) | ORAL | 0 refills | Status: AC | PRN

## 2022-03-01 NOTE — ED Triage Notes (Signed)
Pt reports she tripped on uneven sidewalk and went forward landing in a pushup position with chin hitting pavement and bilateral hands catching. Pt has approx 1/2in puncture wound underneath her chin with reports of pain radiating up her right jaw line and reports of not being able to close her mouth as normal. Pt has abrasions, bruising and bleeding to bilateral hands. Pain expressed in bilateral hands, right shoulder and face(chin/jaw.) Pt denies LOC.

## 2022-03-01 NOTE — Discharge Instructions (Addendum)
As we discussed please change your bandages tomorrow evening and cover with Xeroform and gauze once again.  The following day you may change your bandages daily and just keep covered with Neosporin until well-healed.  Please keep your chin dry for the next 48 hours after which you may get wet but do not rub dry allow it to air dry.  Return to the emergency department for any sign of infection such as increased pain redness pus or fever.  You have been prescribed a pain medication that may be used if needed, otherwise he can use Tylenol or ibuprofen.  Do not drink alcohol or drive while taking the pain medication.

## 2022-03-01 NOTE — ED Provider Notes (Signed)
[facility] Provider Note    Event Date/Time   First MD Initiated Contact with Patient 03/01/22 2012     (approximate)  History   Chief Complaint: Fall  HPI  Donna Cochran is a 66 y.o. female with a past medical history of gastric reflux, arthritis, presents emergency department after a fall.  According to the patient she was walking down the sidewalk when she tripped on a raised area of concrete causing her to fall forward.  Patient hit her chin on the cement when she suffered a small laceration.  She also has abrasions to both of her hands.  Patient is complaining of pain to the hands right shoulder and jaw.  Denies LOC.  No blood thinners.  Physical Exam   Triage Vital Signs: ED Triage Vitals  Enc Vitals Group     BP 03/01/22 1955 114/79     Pulse Rate 03/01/22 1955 91     Resp 03/01/22 1955 17     Temp 03/01/22 1955 97.7 F (36.5 C)     Temp Source 03/01/22 1955 Oral     SpO2 03/01/22 1955 98 %     Weight --      Height --      Head Circumference --      Peak Flow --      Pain Score 03/01/22 2126 7     Pain Loc --      Pain Edu? --      Excl. in [place]? --     Most recent vital signs: Vitals:   03/01/22 1955  BP: 114/79  Pulse: 91  Resp: 17  Temp: 97.7 F (36.5 C)  SpO2: 98%    General: Awake, no distress.  CV:  Good peripheral perfusion.  Regular rate and rhythm  Resp:  Normal effort.  Equal breath sounds bilaterally.  Abd:  No distention.  Soft, nontender.  No rebound or guarding. Other:  Patient has a small 1 cm laceration to the chin that appears clean.  Has several small abrasions to both of her hands.  Patient does state mild tenderness to palpation of the right shoulder but good range of motion on my evaluation.  Good range of motion all other extremities as well without pain elicited.   ED Results / Procedures / Treatments   RADIOLOGY  Reviewed the CT images of the maxillofacial structures I do not see any fracture on  my interpretation of the images. Radiology is read the CT images as negative for acute abnormality in the head neck or face. X-rays are negative of bilateral hands and shoulder   MEDICATIONS ORDERED IN ED: Medications  acetaminophen (TYLENOL) tablet 1,000 mg (1,000 mg Oral Given 03/01/22 2124)     IMPRESSION / MDM / ASSESSMENT AND PLAN / ED COURSE  I reviewed the triage vital signs and the nursing notes.  Patient's presentation is most consistent with acute presentation with potential threat to life or bodily function.  Patient presents emergency department after a fall.  Overall the patient appears well, no LOC.  Patient does have a small 1 cm laceration to the bottom of her chin I have washed this out and applied a small mount of Dermabond.  Patient has abrasions to both of her hands which have been washed cleaned and covered with Xeroform and gauze.  Patient continues to state pain more body aches all over from the fall.  We will prescribe a very short course of pain medication patient otherwise  is to use Tylenol or ibuprofen as needed for discomfort at home.  Patient agreeable to plan of care.  FINAL CLINICAL IMPRESSION(S) / ED DIAGNOSES   Abrasions Laceration Fall  Rx / DC Orders   Tramadol  Note:  This document was prepared using Dragon voice recognition software and may include unintentional dictation errors.   Harvest Dark, MD 03/01/22 2207

## 2022-03-20 ENCOUNTER — Ambulatory Visit
Admission: RE | Admit: 2022-03-20 | Discharge: 2022-03-20 | Disposition: A | Discharge status: Home / Self Care | Payer: BC Managed Care – PPO | Source: Ambulatory Visit | Attending: Obstetrics and Gynecology | Admitting: Obstetrics and Gynecology

## 2022-03-20 DIAGNOSIS — Z1231 Encounter for screening mammogram for malignant neoplasm of breast: Secondary | ICD-10-CM | POA: Insufficient documentation

## 2022-12-02 ENCOUNTER — Other Ambulatory Visit: Payer: Self-pay

## 2022-12-02 ENCOUNTER — Emergency Department
Admission: EM | Admit: 2022-12-02 | Discharge: 2022-12-02 | Disposition: A | Discharge status: Home / Self Care | Payer: BC Managed Care – PPO | Attending: Emergency Medicine | Admitting: Emergency Medicine

## 2022-12-02 DIAGNOSIS — L509 Urticaria, unspecified: Secondary | ICD-10-CM | POA: Insufficient documentation

## 2022-12-02 DIAGNOSIS — Z87891 Personal history of nicotine dependence: Secondary | ICD-10-CM | POA: Insufficient documentation

## 2022-12-02 DIAGNOSIS — Z85828 Personal history of other malignant neoplasm of skin: Secondary | ICD-10-CM | POA: Insufficient documentation

## 2022-12-02 DIAGNOSIS — R21 Rash and other nonspecific skin eruption: Secondary | ICD-10-CM

## 2022-12-02 MED ORDER — DIPHENHYDRAMINE HCL 25 MG PO CAPS
25.0000 mg | ORAL_CAPSULE | Freq: Once | ORAL | Status: AC
  Administered 2022-12-02: 25 mg via ORAL
  Filled 2022-12-02: qty 1

## 2022-12-02 NOTE — ED Provider Notes (Signed)
[facility] Emergency Department Provider Note   ____________________________________________   Event Date/Time   First MD Initiated Contact with Patient 12/02/22 2046     (approximate)  I have reviewed the triage vital signs and the nursing notes.   HISTORY  Chief Complaint Rash    HPI Donna Cochran is a 67 y.o. female presents to the emergency room for a "red mark" to her right upper arm.  Patient reports that approximately 45 minutes to an hour ago she was sitting on her couch watching TV when she felt a burning sensation to her right upper arm.  At that time she looked into the right medial bicep she noticed what appeared to be a scratch down the full length of her bicep.  However, nothing had touched her arm.  She reports this area is irritated, itching, burning.  She denies any new exposure to detergents, foods, lotions, medications.  Patient does not have rash anywhere else on her body.  Patient is not broken out in hives.  Patient is not having any shortness of breath.  Patient has not taken any medication or used any cream to treat the area.   Past Medical History:  Diagnosis Date   Arthritis    Cancer (HCC)    skin    Heartburn    Hematuria    HLD (hyperlipidemia)    PE (pulmonary thromboembolism) (HCC) 2006   Pneumothorax 2006   Single kidney    Left kidney removed 1996    Patient Active Problem List   Diagnosis Date Noted   Ureteropelvic junction (UPJ) obstruction, right 02/07/2016   Microhematuria 02/07/2016   Genetic counseling and testing 12/20/2015    Past Surgical History:  Procedure Laterality Date   KIDNEY SURGERY Left    removal of kidney   SEPTOPLASTY Bilateral 04/15/2020   Procedure: SEPTOPLASTY;  Surgeon: Vernie Murders, MD;  Location: [facility];  Service: ENT;  Laterality: Bilateral;   TURBINATE REDUCTION Right 04/15/2020   Procedure: TURBINATE REDUCTION;  Surgeon: Vernie Murders, MD;  Location: [facility];  Service: ENT;  Laterality: Right;    Prior to Admission medications   Medication Sig Start Date End Date Taking? Authorizing Provider  acetaminophen (TYLENOL) 500 MG tablet Take 500 mg by mouth every 6 (six) hours as needed.    [provider]  conjugated estrogens (PREMARIN) vaginal cream Place vaginally. 11/11/15   [provider]  traMADol (ULTRAM) 50 MG tablet Take 1 tablet (50 mg total) by mouth every 6 (six) hours as needed. 03/01/22   Minna Antis, MD    Allergies Etodolac, Lidocaine, and Sulfa antibiotics  Family History  Problem Relation Age of Onset   Breast cancer Mother 53   Breast cancer Paternal Aunt        great aunt   Breast cancer Cousin 31       maternal cousin   Kidney disease Neg Hx    Bladder Cancer Neg Hx     Social History Social History   Tobacco Use   Smoking status: Former   Smokeless tobacco: Never   Tobacco comments:    in college  Vaping Use   Vaping status: Never Used  Substance Use Topics   Alcohol use: No   Drug use: No    Review of Systems  Constitutional: No fever/chills Cardiovascular: Denies chest pain. Respiratory: Denies shortness of breath. Skin: Scratch to right upper arm. Neurological: Negative for headaches, focal weakness or numbness.   ____________________________________________  PHYSICAL EXAM:  VITAL SIGNS: ED Triage Vitals  Encounter Vitals Group     BP 12/02/22 2027 (!) 108/38     Systolic BP Percentile --      Diastolic BP Percentile --      Pulse Rate 12/02/22 2027 74     Resp 12/02/22 2027 20     Temp 12/02/22 2027 97.8 F (36.6 C)     Temp Source 12/02/22 2027 Oral     SpO2 12/02/22 2027 99 %     Weight 12/02/22 2031 128 lb (58.1 kg)     Height 12/02/22 2031 5\' 6"  (1.676 m)     Head Circumference --      Peak Flow --      Pain Score 12/02/22 2030 0     Pain Loc --      Pain Education --      Exclude from Growth Chart --     Constitutional: Alert and  oriented. Well appearing and in no acute distress. Head: Atraumatic. Cardiovascular: Normal rate, regular rhythm. Grossly normal heart sounds.  Good peripheral circulation. Respiratory: Normal respiratory effort.  No retractions. Lungs CTAB. Neurologic:  Normal speech and language. No gross focal neurologic deficits are appreciated. No gait instability. Skin: Patient has linear scratch the full length of right medial bicep. Psychiatric: Mood and affect are normal. Speech and behavior are normal.  ____________________________________________   LABS (all labs ordered are listed, but only abnormal results are displayed)  Labs Reviewed - No data to display ____________________________________________  EKG   ____________________________________________  RADIOLOGY  ED MD interpretation:    Official radiology report(s): No results found.  ____________________________________________   PROCEDURES  Procedure(s) performed: None  Procedures  Critical Care performed: No  ____________________________________________   INITIAL IMPRESSION / ASSESSMENT AND PLAN / ED COURSE   Donna Cochran is a 67 y.o. female presents to the emergency room for a "red mark" to her right upper arm.  Patient reports that approximately 45 minutes to an hour ago she was sitting on her couch watching TV when she felt a burning sensation to her right upper arm.  At that time she looked into the right medial bicep she noticed what appeared to be a scratch down the full length of her bicep.  However, nothing had touched her arm.  She reports this area is irritated, itching, burning.  She denies any new exposure to detergents, foods, lotions, medications.  Patient does not have rash anywhere else on her body.  Patient is not broken out in hives.  Patient is not having any shortness of breath.  Patient has not taken any medication or used any cream to treat the area.  I recommend that patient take Benadryl.   She can also use over-the-counter hydrocortisone to the area.  Patient reports that she does not have Benadryl at home and would like to be given a dose here in the emergency room.  I will provide this to her.  Reassurance is offered.  Patient will be discharged home in stable condition at this time.      ____________________________________________   FINAL CLINICAL IMPRESSION(S) / ED DIAGNOSES  Final diagnoses:  Rash  Urticaria     ED Discharge Orders     None        Note:  This document was prepared using Dragon voice recognition software and may include unintentional dictation errors.     Herschell Dimes, NP 12/02/22 2107    Sharyn Creamer, MD 12/04/22 [phone]

## 2022-12-02 NOTE — Discharge Instructions (Signed)
You have been seen today for a rash to your right upper arm.  It is undetermined at this time what the rash was caused from.  However I feel that Benadryl will treat it.  You have been given Benadryl in the emergency room.  You should also use hydrocortisone at home.  Follow-up with your primary care provider as needed.

## 2022-12-02 NOTE — ED Triage Notes (Signed)
Pt complaining of a red mark on the right arm on the medial aspect of the bicep. Started about 40 min ago. May have some redness below the red mark. Said the it burns and now itches.

## 2023-06-20 ENCOUNTER — Other Ambulatory Visit: Payer: Self-pay | Admitting: Physician Assistant

## 2023-06-20 DIAGNOSIS — N6310 Unspecified lump in the right breast, unspecified quadrant: Secondary | ICD-10-CM

## 2023-06-20 DIAGNOSIS — R2231 Localized swelling, mass and lump, right upper limb: Secondary | ICD-10-CM

## 2023-06-20 DIAGNOSIS — Z1231 Encounter for screening mammogram for malignant neoplasm of breast: Secondary | ICD-10-CM

## 2023-07-13 ENCOUNTER — Other Ambulatory Visit: Payer: Self-pay | Admitting: Physician Assistant

## 2023-07-13 ENCOUNTER — Ambulatory Visit
Admission: RE | Admit: 2023-07-13 | Discharge: 2023-07-13 | Disposition: A | Discharge status: Home / Self Care | Source: Ambulatory Visit | Attending: Physician Assistant | Admitting: Physician Assistant

## 2023-07-13 DIAGNOSIS — Z1231 Encounter for screening mammogram for malignant neoplasm of breast: Secondary | ICD-10-CM | POA: Diagnosis present

## 2023-07-13 DIAGNOSIS — N6310 Unspecified lump in the right breast, unspecified quadrant: Secondary | ICD-10-CM | POA: Diagnosis present

## 2023-07-13 DIAGNOSIS — R2231 Localized swelling, mass and lump, right upper limb: Secondary | ICD-10-CM | POA: Diagnosis present

## 2023-07-13 DIAGNOSIS — N6331 Unspecified lump in axillary tail of the right breast: Secondary | ICD-10-CM | POA: Diagnosis not present

## 2024-02-11 ENCOUNTER — Other Ambulatory Visit: Payer: Self-pay | Admitting: Physician Assistant

## 2024-02-11 DIAGNOSIS — R2231 Localized swelling, mass and lump, right upper limb: Secondary | ICD-10-CM

## 2024-02-21 ENCOUNTER — Ambulatory Visit
Admission: RE | Admit: 2024-02-21 | Discharge: 2024-02-21 | Disposition: A | Discharge status: Home / Self Care | Source: Ambulatory Visit | Attending: Physician Assistant | Admitting: Physician Assistant

## 2024-02-21 DIAGNOSIS — R2231 Localized swelling, mass and lump, right upper limb: Secondary | ICD-10-CM | POA: Insufficient documentation
